# Patient Record
Sex: Male | Born: 1980
Health system: Southern US, Community
[De-identification: ages and names within clinical notes are randomized; demographics above are authoritative.]

## PROBLEM LIST (undated history)

## (undated) DIAGNOSIS — M545 Low back pain, unspecified: Secondary | ICD-10-CM

## (undated) DIAGNOSIS — N529 Male erectile dysfunction, unspecified: Secondary | ICD-10-CM

## (undated) DIAGNOSIS — F329 Major depressive disorder, single episode, unspecified: Secondary | ICD-10-CM

## (undated) DIAGNOSIS — A6000 Herpesviral infection of urogenital system, unspecified: Secondary | ICD-10-CM

## (undated) DIAGNOSIS — F32A Depression, unspecified: Secondary | ICD-10-CM

## (undated) DIAGNOSIS — H6992 Unspecified Eustachian tube disorder, left ear: Secondary | ICD-10-CM

## (undated) DIAGNOSIS — G43909 Migraine, unspecified, not intractable, without status migrainosus: Secondary | ICD-10-CM

## (undated) DIAGNOSIS — J189 Pneumonia, unspecified organism: Secondary | ICD-10-CM

## (undated) DIAGNOSIS — K259 Gastric ulcer, unspecified as acute or chronic, without hemorrhage or perforation: Secondary | ICD-10-CM

## (undated) DIAGNOSIS — F419 Anxiety disorder, unspecified: Secondary | ICD-10-CM

## (undated) DIAGNOSIS — H6982 Other specified disorders of Eustachian tube, left ear: Secondary | ICD-10-CM

## (undated) HISTORY — DX: Unspecified Eustachian tube disorder, left ear: H69.92

## (undated) HISTORY — DX: Major depressive disorder, single episode, unspecified: F32.9

## (undated) HISTORY — DX: Low back pain, unspecified: M54.50

## (undated) HISTORY — DX: Pneumonia, unspecified organism: J18.9

## (undated) HISTORY — DX: Depression, unspecified: F32.A

## (undated) HISTORY — DX: Anxiety disorder, unspecified: F41.9

## (undated) HISTORY — DX: Male erectile dysfunction, unspecified: N52.9

## (undated) HISTORY — DX: Gastric ulcer, unspecified as acute or chronic, without hemorrhage or perforation: K25.9

## (undated) HISTORY — DX: Low back pain: M54.5

## (undated) HISTORY — DX: Migraine, unspecified, not intractable, without status migrainosus: G43.909

## (undated) HISTORY — DX: Other specified disorders of eustachian tube, left ear: H69.82

## (undated) HISTORY — DX: Herpesviral infection of urogenital system, unspecified: A60.00

## (undated) HISTORY — PX: TONSILLECTOMY: SUR1361

---
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
## 2003-11-17 ENCOUNTER — Emergency Department (HOSPITAL_COMMUNITY): Admission: EM | Admit: 2003-11-17 | Discharge: 2003-11-17 | Payer: Self-pay

## 2003-11-26 ENCOUNTER — Ambulatory Visit: Admission: AD | Admit: 2003-11-26 | Discharge: 2003-11-26 | Payer: Self-pay | Admitting: Orthopedic Surgery

## 2004-02-22 ENCOUNTER — Ambulatory Visit (HOSPITAL_COMMUNITY): Admission: RE | Admit: 2004-02-22 | Discharge: 2004-02-22 | Payer: Self-pay | Admitting: Orthopedic Surgery

## 2007-02-22 ENCOUNTER — Ambulatory Visit: Payer: Self-pay | Admitting: Family Medicine

## 2009-12-31 ENCOUNTER — Emergency Department (HOSPITAL_COMMUNITY): Admission: EM | Admit: 2009-12-31 | Discharge: 2009-12-31 | Payer: Self-pay | Admitting: Emergency Medicine

## 2010-12-03 LAB — POCT I-STAT 3, ART BLOOD GAS (G3+)
Bicarbonate: 26.4 mEq/L — ABNORMAL HIGH (ref 20.0–24.0)
O2 Saturation: 96 %
Patient temperature: 95.3
TCO2: 28 mmol/L (ref 0–100)
pCO2 arterial: 46 mmHg — ABNORMAL HIGH (ref 35.0–45.0)
pH, Arterial: 7.358 (ref 7.350–7.450)
pO2, Arterial: 76 mmHg — ABNORMAL LOW (ref 80.0–100.0)

## 2010-12-03 LAB — DIFFERENTIAL
Basophils Absolute: 0 10*3/uL (ref 0.0–0.1)
Basophils Relative: 0 % (ref 0–1)
Eosinophils Absolute: 0.1 10*3/uL (ref 0.0–0.7)
Eosinophils Relative: 1 % (ref 0–5)
Lymphocytes Relative: 28 % (ref 12–46)
Lymphs Abs: 3 10*3/uL (ref 0.7–4.0)
Monocytes Absolute: 0.8 10*3/uL (ref 0.1–1.0)
Monocytes Relative: 8 % (ref 3–12)
Neutro Abs: 6.7 10*3/uL (ref 1.7–7.7)
Neutrophils Relative %: 63 % (ref 43–77)

## 2010-12-03 LAB — PROTIME-INR
INR: 1.06 (ref 0.00–1.49)
Prothrombin Time: 13.7 seconds (ref 11.6–15.2)

## 2010-12-03 LAB — CBC
HCT: 36.1 % — ABNORMAL LOW (ref 39.0–52.0)
Hemoglobin: 12.6 g/dL — ABNORMAL LOW (ref 13.0–17.0)
MCHC: 34.9 g/dL (ref 30.0–36.0)
MCV: 90.4 fL (ref 78.0–100.0)
Platelets: 196 10*3/uL (ref 150–400)
RBC: 4 MIL/uL — ABNORMAL LOW (ref 4.22–5.81)
RDW: 14 % (ref 11.5–15.5)
WBC: 10.7 10*3/uL — ABNORMAL HIGH (ref 4.0–10.5)

## 2010-12-03 LAB — URINALYSIS, ROUTINE W REFLEX MICROSCOPIC
Bilirubin Urine: NEGATIVE
Glucose, UA: 100 mg/dL — AB
Hgb urine dipstick: NEGATIVE
Ketones, ur: NEGATIVE mg/dL
Nitrite: NEGATIVE
Protein, ur: NEGATIVE mg/dL
Specific Gravity, Urine: 1.018 (ref 1.005–1.030)
Urobilinogen, UA: 0.2 mg/dL (ref 0.0–1.0)
pH: 5.5 (ref 5.0–8.0)

## 2010-12-03 LAB — HEPATIC FUNCTION PANEL
ALT: 16 U/L (ref 0–53)
AST: 16 U/L (ref 0–37)
Albumin: 3.3 g/dL — ABNORMAL LOW (ref 3.5–5.2)
Alkaline Phosphatase: 48 U/L (ref 39–117)
Bilirubin, Direct: <0.1 mg/dL (ref 0.0–0.3)
Total Bilirubin: 0.3 mg/dL (ref 0.3–1.2)
Total Protein: 5.4 g/dL — ABNORMAL LOW (ref 6.0–8.3)

## 2010-12-03 LAB — POCT I-STAT, CHEM 8
BUN: 11 mg/dL (ref 6–23)
Calcium, Ion: 1.03 mmol/L — ABNORMAL LOW (ref 1.12–1.32)
Chloride: 106 mEq/L (ref 96–112)
Creatinine, Ser: 0.8 mg/dL (ref 0.4–1.5)
Glucose, Bld: 167 mg/dL — ABNORMAL HIGH (ref 70–99)
HCT: 43 % (ref 39.0–52.0)
Hemoglobin: 14.6 g/dL (ref 13.0–17.0)
Potassium: 3.7 mEq/L (ref 3.5–5.1)
Sodium: 141 mEq/L (ref 135–145)
TCO2: 21 mmol/L (ref 0–100)

## 2010-12-03 LAB — RAPID URINE DRUG SCREEN, HOSP PERFORMED
Amphetamines: NOT DETECTED
Barbiturates: NOT DETECTED
Benzodiazepines: NOT DETECTED
Cocaine: NOT DETECTED
Opiates: NOT DETECTED
Tetrahydrocannabinol: NOT DETECTED

## 2010-12-03 LAB — POCT CARDIAC MARKERS
CKMB, poc: <1 ng/mL — ABNORMAL LOW (ref 1.0–8.0)
Myoglobin, poc: 50.2 ng/mL (ref 12–200)
Troponin i, poc: <0.05 ng/mL (ref 0.00–0.09)

## 2010-12-03 LAB — ACETAMINOPHEN LEVEL: Acetaminophen (Tylenol), Serum: <10 ug/mL — ABNORMAL LOW (ref 10–30)

## 2010-12-03 LAB — SALICYLATE LEVEL: Salicylate Lvl: <4 mg/dL (ref 2.8–20.0)

## 2010-12-03 LAB — TRICYCLICS SCREEN, URINE: TCA Scrn: NOT DETECTED

## 2010-12-03 LAB — ETHANOL: Alcohol, Ethyl (B): <5 mg/dL (ref 0–10)
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
## 2014-05-21 ENCOUNTER — Emergency Department (HOSPITAL_BASED_OUTPATIENT_CLINIC_OR_DEPARTMENT_OTHER)
Admission: EM | Admit: 2014-05-21 | Discharge: 2014-05-21 | Disposition: A | Discharge status: Home / Self Care | Payer: Self-pay | Attending: Emergency Medicine | Admitting: Emergency Medicine

## 2014-05-21 ENCOUNTER — Emergency Department (HOSPITAL_BASED_OUTPATIENT_CLINIC_OR_DEPARTMENT_OTHER): Payer: Self-pay

## 2014-05-21 ENCOUNTER — Encounter (HOSPITAL_BASED_OUTPATIENT_CLINIC_OR_DEPARTMENT_OTHER): Payer: Self-pay | Admitting: Emergency Medicine

## 2014-05-21 DIAGNOSIS — S9031XA Contusion of right foot, initial encounter: Secondary | ICD-10-CM

## 2014-05-21 DIAGNOSIS — F17200 Nicotine dependence, unspecified, uncomplicated: Secondary | ICD-10-CM | POA: Insufficient documentation

## 2014-05-21 DIAGNOSIS — Z23 Encounter for immunization: Secondary | ICD-10-CM | POA: Insufficient documentation

## 2014-05-21 DIAGNOSIS — IMO0002 Reserved for concepts with insufficient information to code with codable children: Secondary | ICD-10-CM | POA: Insufficient documentation

## 2014-05-21 DIAGNOSIS — T07XXXA Unspecified multiple injuries, initial encounter: Secondary | ICD-10-CM

## 2014-05-21 DIAGNOSIS — Y92410 Unspecified street and highway as the place of occurrence of the external cause: Secondary | ICD-10-CM | POA: Insufficient documentation

## 2014-05-21 DIAGNOSIS — S9030XA Contusion of unspecified foot, initial encounter: Secondary | ICD-10-CM | POA: Insufficient documentation

## 2014-05-21 DIAGNOSIS — Y9389 Activity, other specified: Secondary | ICD-10-CM | POA: Insufficient documentation

## 2014-05-21 DIAGNOSIS — S0990XA Unspecified injury of head, initial encounter: Secondary | ICD-10-CM | POA: Insufficient documentation

## 2014-05-21 DIAGNOSIS — S4980XA Other specified injuries of shoulder and upper arm, unspecified arm, initial encounter: Secondary | ICD-10-CM | POA: Insufficient documentation

## 2014-05-21 DIAGNOSIS — S46909A Unspecified injury of unspecified muscle, fascia and tendon at shoulder and upper arm level, unspecified arm, initial encounter: Secondary | ICD-10-CM | POA: Insufficient documentation

## 2014-05-21 MED ORDER — TETANUS-DIPHTH-ACELL PERTUSSIS 5-2.5-18.5 LF-MCG/0.5 IM SUSP
0.5000 mL | Freq: Once | INTRAMUSCULAR | Status: AC
  Administered 2014-05-21: 0.5 mL via INTRAMUSCULAR
  Filled 2014-05-21: qty 0.5

## 2014-05-21 MED ORDER — OXYCODONE-ACETAMINOPHEN 5-325 MG PO TABS
2.0000 | ORAL_TABLET | Freq: Once | ORAL | Status: AC
  Administered 2014-05-21: 2 via ORAL
  Filled 2014-05-21: qty 2

## 2014-05-21 MED ORDER — OXYCODONE-ACETAMINOPHEN 5-325 MG PO TABS: 1.0000 | ORAL_TABLET | ORAL | Status: DC | PRN

## 2014-05-21 NOTE — Discharge Instructions (Signed)
Abrasion An abrasion is a cut or scrape of the skin. Abrasions do not extend through all layers of the skin and most heal within 10 days. It is important to care for your abrasion properly to prevent infection. CAUSES  Most abrasions are caused by falling on, or gliding across, the ground or other surface. When your skin rubs on something, the outer and inner layer of skin rubs off, causing an abrasion. DIAGNOSIS  Your caregiver will be able to diagnose an abrasion during a physical exam.  TREATMENT  Your treatment depends on how large and deep the abrasion is. Generally, your abrasion will be cleaned with water and a mild soap to remove any dirt or debris. An antibiotic ointment may be put over the abrasion to prevent an infection. A bandage (dressing) may be wrapped around the abrasion to keep it from getting dirty.  You may need a tetanus shot if:  You cannot remember when you had your last tetanus shot.  You have never had a tetanus shot.  The injury broke your skin. If you get a tetanus shot, your arm may swell, get red, and feel warm to the touch. This is common and not a problem. If you need a tetanus shot and you choose not to have one, there is a rare chance of getting tetanus. Sickness from tetanus can be serious.  HOME CARE INSTRUCTIONS   If a dressing was applied, change it at least once a day or as directed by your caregiver. If the bandage sticks, soak it off with warm water.   Wash the area with water and a mild soap to remove all the ointment 2 times a day. Rinse off the soap and pat the area dry with a clean towel.   Reapply any ointment as directed by your caregiver. This will help prevent infection and keep the bandage from sticking. Use gauze over the wound and under the dressing to help keep the bandage from sticking.   Change your dressing right away if it becomes wet or dirty.   Only take over-the-counter or prescription medicines for pain, discomfort, or fever as  directed by your caregiver.   Follow up with your caregiver within 24-48 hours for a wound check, or as directed. If you were not given a wound-check appointment, look closely at your abrasion for redness, swelling, or pus. These are signs of infection. SEEK IMMEDIATE MEDICAL CARE IF:   You have increasing pain in the wound.   You have redness, swelling, or tenderness around the wound.   You have pus coming from the wound.   You have a fever or persistent symptoms for more than 2-3 days.  You have a fever and your symptoms suddenly get worse.  You have a bad smell coming from the wound or dressing.  MAKE SURE YOU:   Understand these instructions.  Will watch your condition.  Will get help right away if you are not doing well or get worse. Document Released: 06/11/2005 Document Revised: 08/18/2012 Document Reviewed: 08/05/2011 [facility] Patient Information 2015 [place], [place]. This information is not intended to replace advice given to you by your health care provider. Make sure you discuss any questions you have with your health care provider.    Blunt Trauma You have been evaluated for injuries. You have been examined and your caregiver has not found injuries serious enough to require hospitalization. It is common to have multiple bruises and sore muscles following an accident. These tend to feel worse for  the first 24 hours. You will feel more stiffness and soreness over the next several hours and worse when you wake up the first morning after your accident. After this point, you should begin to improve with each passing day. The amount of improvement depends on the amount of damage done in the accident. Following your accident, if some part of your body does not work as it should, or if the pain in any area continues to increase, you should return to the Emergency Department for re-evaluation.  HOME CARE INSTRUCTIONS  Routine care for sore areas should include:  Ice to sore  areas every 2 hours for 20 minutes while awake for the next 2 days.  Drink extra fluids (not alcohol).  Take a hot or warm shower or bath once or twice a day to increase blood flow to sore muscles. This will help you "limber up".  Activity as tolerated. Lifting may aggravate neck or back pain.  Only take over-the-counter or prescription medicines for pain, discomfort, or fever as directed by your caregiver. Do not use aspirin. This may increase bruising or increase bleeding if there are small areas where this is happening. SEEK IMMEDIATE MEDICAL CARE IF:  Numbness, tingling, weakness, or problem with the use of your arms or legs.  A severe headache is not relieved with medications.  There is a change in bowel or bladder control.  Increasing pain in any areas of the body.  Short of breath or dizzy.  Nauseated, vomiting, or sweating.  Increasing belly (abdominal) discomfort.  Blood in urine, stool, or vomiting blood.  Pain in either shoulder in an area where a shoulder strap would be.  Feelings of lightheadedness or if you have a fainting episode. Sometimes it is not possible to identify all injuries immediately after the trauma. It is important that you continue to monitor your condition after the emergency department visit. If you feel you are not improving, or improving more slowly than should be expected, call your physician. If you feel your symptoms (problems) are worsening, return to the Emergency Department immediately. Document Released: 05/28/2001 Document Revised: 11/24/2011 Document Reviewed: 04/19/2008 [facility] Patient Information 2015 [place], [place]. This information is not intended to replace advice given to you by your health care provider. Make sure you discuss any questions you have with your health care provider.    Contusion A contusion is a deep bruise. Contusions are the result of an injury that caused bleeding under the skin. The contusion may turn blue,  purple, or yellow. Minor injuries will give you a painless contusion, but more severe contusions may stay painful and swollen for a few weeks.  CAUSES  A contusion is usually caused by a blow, trauma, or direct force to an area of the body. SYMPTOMS   Swelling and redness of the injured area.  Bruising of the injured area.  Tenderness and soreness of the injured area.  Pain. DIAGNOSIS  The diagnosis can be made by taking a history and physical exam. An X-ray, CT scan, or MRI may be needed to determine if there were any associated injuries, such as fractures. TREATMENT  Specific treatment will depend on what area of the body was injured. In general, the best treatment for a contusion is resting, icing, elevating, and applying cold compresses to the injured area. Over-the-counter medicines may also be recommended for pain control. Ask your caregiver what the best treatment is for your contusion. HOME CARE INSTRUCTIONS   Put ice on the injured area.  Put ice in a plastic bag.  Place a towel between your skin and the bag.  Leave the ice on for 15-20 minutes, 3-4 times a day, or as directed by your health care provider.  Only take over-the-counter or prescription medicines for pain, discomfort, or fever as directed by your caregiver. Your caregiver may recommend avoiding anti-inflammatory medicines (aspirin, ibuprofen, and naproxen) for 48 hours because these medicines may increase bruising.  Rest the injured area.  If possible, elevate the injured area to reduce swelling. SEEK IMMEDIATE MEDICAL CARE IF:   You have increased bruising or swelling.  You have pain that is getting worse.  Your swelling or pain is not relieved with medicines. MAKE SURE YOU:   Understand these instructions.  Will watch your condition.  Will get help right away if you are not doing well or get worse. Document Released: 06/11/2005 Document Revised: 09/06/2013 Document Reviewed: 07/07/2011 [facility]  Patient Information 2015 [place], [place]. This information is not intended to replace advice given to you by your health care provider. Make sure you discuss any questions you have with your health care provider.   Head Injury You have received a head injury. It does not appear serious at this time. Headaches and vomiting are common following head injury. It should be easy to awaken from sleeping. Sometimes it is necessary for you to stay in the emergency department for a while for observation. Sometimes admission to the hospital may be needed. After injuries such as yours, most problems occur within the first 24 hours, but side effects may occur up to 7-10 days after the injury. It is important for you to carefully monitor your condition and contact your health care provider or seek immediate medical care if there is a change in your condition. WHAT ARE THE TYPES OF HEAD INJURIES? Head injuries can be as minor as a bump. Some head injuries can be more severe. More severe head injuries include:  A jarring injury to the brain (concussion).  A bruise of the brain (contusion). This mean there is bleeding in the brain that can cause swelling.  A cracked skull (skull fracture).  Bleeding in the brain that collects, clots, and forms a bump (hematoma). WHAT CAUSES A HEAD INJURY? A serious head injury is most likely to happen to someone who is in a car wreck and is not wearing a seat belt. Other causes of major head injuries include bicycle or motorcycle accidents, sports injuries, and falls. HOW ARE HEAD INJURIES DIAGNOSED? A complete history of the event leading to the injury and your current symptoms will be helpful in diagnosing head injuries. Many times, pictures of the brain, such as CT or MRI are needed to see the extent of the injury. Often, an overnight hospital stay is necessary for observation.  WHEN SHOULD I SEEK IMMEDIATE MEDICAL CARE?  You should get help right away if:  You have confusion  or drowsiness.  You feel sick to your stomach (nauseous) or have continued, forceful vomiting.  You have dizziness or unsteadiness that is getting worse.  You have severe, continued headaches not relieved by medicine. Only take over-the-counter or prescription medicines for pain, fever, or discomfort as directed by your health care provider.  You do not have normal function of the arms or legs or are unable to walk.  You notice changes in the black spots in the center of the colored part of your eye (pupil).  You have a clear or bloody fluid coming from your nose  or ears.  You have a loss of vision. During the next 24 hours after the injury, you must stay with someone who can watch you for the warning signs. This person should contact local emergency services (911 in the [place] if you have seizures, you become unconscious, or you are unable to wake up. HOW CAN I PREVENT A HEAD INJURY IN THE FUTURE? The most important factor for preventing major head injuries is avoiding motor vehicle accidents. To minimize the potential for damage to your head, it is crucial to wear seat belts while riding in motor vehicles. Wearing helmets while bike riding and playing collision sports (like football) is also helpful. Also, avoiding dangerous activities around the house will further help reduce your risk of head injury.  WHEN CAN I RETURN TO NORMAL ACTIVITIES AND ATHLETICS? You should be reevaluated by your health care provider before returning to these activities. If you have any of the following symptoms, you should not return to activities or contact sports until 1 week after the symptoms have stopped:  Persistent headache.  Dizziness or vertigo.  Poor attention and concentration.  Confusion.  Memory problems.  Nausea or vomiting.  Fatigue or tire easily.  Irritability.  Intolerant of bright lights or loud noises.  Anxiety or depression.  Disturbed sleep. MAKE SURE YOU:   Understand  these instructions.  Will watch your condition.  Will get help right away if you are not doing well or get worse. Document Released: 09/01/2005 Document Revised: 09/06/2013 Document Reviewed: 05/09/2013 [facility] Patient Information 2015 [place], [place]. This information is not intended to replace advice given to you by your health care provider. Make sure you discuss any questions you have with your health care provider.   Wound Care Wound care helps prevent pain and infection.  You may need a tetanus shot if:  You cannot remember when you had your last tetanus shot.  You have never had a tetanus shot.  The injury broke your skin. If you need a tetanus shot and you choose not to have one, you may get tetanus. Sickness from tetanus can be serious. HOME CARE   Only take medicine as told by your doctor.  Clean the wound daily with mild soap and water.  Change any bandages (dressings) as told by your doctor.  Put medicated cream and a bandage on the wound as told by your doctor.  Change the bandage if it gets wet, dirty, or starts to smell.  Take showers. Do not take baths, swim, or do anything that puts your wound under water.  Rest and raise (elevate) the wound until the pain and puffiness (swelling) are better.  Keep all doctor visits as told. GET HELP RIGHT AWAY IF:   Yellowish-white fluid (pus) comes from the wound.  Medicine does not lessen your pain.  There is a red streak going away from the wound.  You have a fever. MAKE SURE YOU:   Understand these instructions.  Will watch your condition.  Will get help right away if you are not doing well or get worse. Document Released: 06/10/2008 Document Revised: 11/24/2011 Document Reviewed: 01/05/2011 [facility] Patient Information 2015 [place], [place]. This information is not intended to replace advice given to you by your health care provider. Make sure you discuss any questions you have with your health care  provider.
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
## 2014-05-21 NOTE — ED Notes (Signed)
ICE pack to rt foot

## 2014-09-15 HISTORY — PX: APPENDECTOMY: SHX54

## 2014-11-27 ENCOUNTER — Telehealth: Payer: Self-pay | Admitting: Family Medicine

## 2014-11-27 ENCOUNTER — Encounter (HOSPITAL_BASED_OUTPATIENT_CLINIC_OR_DEPARTMENT_OTHER): Payer: Self-pay | Admitting: Emergency Medicine

## 2014-11-27 ENCOUNTER — Emergency Department (HOSPITAL_BASED_OUTPATIENT_CLINIC_OR_DEPARTMENT_OTHER)
Admission: EM | Admit: 2014-11-27 | Discharge: 2014-11-27 | Discharge status: Against Medical Advice | Payer: Managed Care, Other (non HMO) | Attending: Emergency Medicine | Admitting: Emergency Medicine

## 2014-11-27 DIAGNOSIS — Z720 Tobacco use: Secondary | ICD-10-CM | POA: Insufficient documentation

## 2014-11-27 DIAGNOSIS — R109 Unspecified abdominal pain: Secondary | ICD-10-CM | POA: Insufficient documentation

## 2014-11-27 NOTE — ED Notes (Signed)
Pt LWBS after triage.

## 2014-11-27 NOTE — ED Notes (Signed)
Pt states he has been having abd pain since yesterday and just not getting any better.  Denies any n/v/d

## 2014-12-05 NOTE — Telephone Encounter (Signed)
Patient did not respond to attempts to contact. Encounter closed.

## 2015-03-26 ENCOUNTER — Encounter: Payer: Self-pay | Admitting: Family Medicine

## 2015-03-26 ENCOUNTER — Ambulatory Visit (INDEPENDENT_AMBULATORY_CARE_PROVIDER_SITE_OTHER): Payer: Managed Care, Other (non HMO) | Admitting: Family Medicine

## 2015-03-26 VITALS — BP 109/75 | HR 100 | Temp 97.9°F | Resp 16 | Ht 66.5 in | Wt 174.0 lb

## 2015-03-26 DIAGNOSIS — S39012A Strain of muscle, fascia and tendon of lower back, initial encounter: Secondary | ICD-10-CM | POA: Diagnosis not present

## 2015-03-26 MED ORDER — OXYCODONE-ACETAMINOPHEN 5-325 MG PO TABS: ORAL_TABLET | ORAL | Status: DC
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
## 2015-03-26 NOTE — Patient Instructions (Signed)
Back Exercises These exercises may help you when beginning to rehabilitate your injury. Your symptoms may resolve with or without further involvement from your physician, physical therapist or athletic trainer. While completing these exercises, remember:   Restoring tissue flexibility helps normal motion to return to the joints. This allows healthier, less painful movement and activity.  An effective stretch should be held for at least 30 seconds.  A stretch should never be painful. You should only feel a gentle lengthening or release in the stretched tissue. STRETCH - Extension, Prone on Elbows   Lie on your stomach on the floor, a bed will be too soft. Place your palms about shoulder width apart and at the height of your head.  Place your elbows under your shoulders. If this is too painful, stack pillows under your chest.  Allow your body to relax so that your hips drop lower and make contact more completely with the floor.  Hold this position for __________ seconds.  Slowly return to lying flat on the floor. Repeat __________ times. Complete this exercise __________ times per day.  RANGE OF MOTION - Extension, Prone Press Ups   Lie on your stomach on the floor, a bed will be too soft. Place your palms about shoulder width apart and at the height of your head.  Keeping your back as relaxed as possible, slowly straighten your elbows while keeping your hips on the floor. You may adjust the placement of your hands to maximize your comfort. As you gain motion, your hands will come more underneath your shoulders.  Hold this position __________ seconds.  Slowly return to lying flat on the floor. Repeat __________ times. Complete this exercise __________ times per day.  RANGE OF MOTION- Quadruped, Neutral Spine   Assume a hands and knees position on a firm surface. Keep your hands under your shoulders and your knees under your hips. You may place padding under your knees for  comfort.  Drop your head and point your tail bone toward the ground below you. This will round out your low back like an angry cat. Hold this position for __________ seconds.  Slowly lift your head and release your tail bone so that your back sags into a large arch, like an old horse.  Hold this position for __________ seconds.  Repeat this until you feel limber in your low back.  Now, find your "sweet spot." This will be the most comfortable position somewhere between the two previous positions. This is your neutral spine. Once you have found this position, tense your stomach muscles to support your low back.  Hold this position for __________ seconds. Repeat __________ times. Complete this exercise __________ times per day.  STRETCH - Flexion, Single Knee to Chest   Lie on a firm bed or floor with both legs extended in front of you.  Keeping one leg in contact with the floor, bring your opposite knee to your chest. Hold your leg in place by either grabbing behind your thigh or at your knee.  Pull until you feel a gentle stretch in your low back. Hold __________ seconds.  Slowly release your grasp and repeat the exercise with the opposite side. Repeat __________ times. Complete this exercise __________ times per day.  STRETCH - Hamstrings, Standing  Stand or sit and extend your right / left leg, placing your foot on a chair or foot stool  Keeping a slight arch in your low back and your hips straight forward.  Lead with your chest and   lean forward at the waist until you feel a gentle stretch in the back of your right / left knee or thigh. (When done correctly, this exercise requires leaning only a small distance.)  Hold this position for __________ seconds. Repeat __________ times. Complete this stretch __________ times per day. STRENGTHENING - Deep Abdominals, Pelvic Tilt   Lie on a firm bed or floor. Keeping your legs in front of you, bend your knees so they are both pointed  toward the ceiling and your feet are flat on the floor.  Tense your lower abdominal muscles to press your low back into the floor. This motion will rotate your pelvis so that your tail bone is scooping upwards rather than pointing at your feet or into the floor.  With a gentle tension and even breathing, hold this position for __________ seconds. Repeat __________ times. Complete this exercise __________ times per day.  STRENGTHENING - Abdominals, Crunches   Lie on a firm bed or floor. Keeping your legs in front of you, bend your knees so they are both pointed toward the ceiling and your feet are flat on the floor. Cross your arms over your chest.  Slightly tip your chin down without bending your neck.  Tense your abdominals and slowly lift your trunk high enough to just clear your shoulder blades. Lifting higher can put excessive stress on the low back and does not further strengthen your abdominal muscles.  Control your return to the starting position. Repeat __________ times. Complete this exercise __________ times per day.  STRENGTHENING - Quadruped, Opposite UE/LE Lift   Assume a hands and knees position on a firm surface. Keep your hands under your shoulders and your knees under your hips. You may place padding under your knees for comfort.  Find your neutral spine and gently tense your abdominal muscles so that you can maintain this position. Your shoulders and hips should form a rectangle that is parallel with the floor and is not twisted.  Keeping your trunk steady, lift your right hand no higher than your shoulder and then your left leg no higher than your hip. Make sure you are not holding your breath. Hold this position __________ seconds.  Continuing to keep your abdominal muscles tense and your back steady, slowly return to your starting position. Repeat with the opposite arm and leg. Repeat __________ times. Complete this exercise __________ times per day. Document Released:  09/19/2005 Document Revised: 11/24/2011 Document Reviewed: 12/14/2008 Exit[employer] Patient Information 2015 [employer]. This information is not intended to replace advice given to you by your health care provider. Make sure you discuss any questions you have with your health care provider.  

## 2015-03-26 NOTE — Progress Notes (Signed)
Pre visit review using our clinic review tool, if applicable. No additional management support is needed unless otherwise documented below in the visit note. 

## 2015-04-02 ENCOUNTER — Encounter: Payer: Self-pay | Admitting: *Deleted

## 2015-04-02 ENCOUNTER — Ambulatory Visit (INDEPENDENT_AMBULATORY_CARE_PROVIDER_SITE_OTHER): Payer: Managed Care, Other (non HMO) | Admitting: Family Medicine

## 2015-04-02 ENCOUNTER — Encounter: Payer: Self-pay | Admitting: Family Medicine

## 2015-04-02 VITALS — BP 114/76 | HR 77 | Temp 97.8°F | Resp 16 | Ht 66.5 in | Wt 176.0 lb

## 2015-04-02 DIAGNOSIS — S39012D Strain of muscle, fascia and tendon of lower back, subsequent encounter: Secondary | ICD-10-CM | POA: Diagnosis not present

## 2015-04-02 NOTE — Progress Notes (Signed)
Pre visit review using our clinic review tool, if applicable. No additional management support is needed unless otherwise documented below in the visit note. 

## 2015-04-02 NOTE — Progress Notes (Signed)
OFFICE NOTE  04/02/2015  CC:  Chief Complaint  Patient presents with  . Follow-up    1 week f/u. Pt is fasting.      HPI: Patient is a 34 y.o. Caucasian male who is here for 1 week f/u low back strain.   I gave him percocet, home stretches, note to stay out of work until today.   Much improved! Taking ibuprofen, no longer on pain meds.  He feels much improved and is ready to return to work tomorrow.  Pertinent PMH:  Past medical, surgical, social, and family history reviewed and no changes are noted since last office visit.  MEDS:  Outpatient Prescriptions Prior to Visit  Medication Sig Dispense Refill  . oxyCODONE-acetaminophen (PERCOCET/ROXICET) 5-325 MG per tablet 1-2 tabs po tid prn 40 tablet 0   No facility-administered medications prior to visit.    PE: Blood pressure 114/76, pulse 77, temperature 97.8 F (36.6 C), temperature source Oral, resp. rate 16, height 5' 6.5" (1.689 m), weight 176 lb (79.833 kg), SpO2 96 %. Gen: Alert, well appearing.  Patient is oriented to person, place, time, and situation. LB: no TTP.  L/S spine ROM fully intact with only mild discomfort with L spine extension. Sitting SLR neg bilat, with only a small amount of LB discomfort with full R leg extension. LE strength 5/5 prox and dist. LE DTRs 2+ patellar and achilles bilat.  IMPRESSION AND PLAN:  Low back strain, much improved and ready to return to work without restrictions. Correct lifting reviewed. May continue ibuprofen prn. No new rx's given today. Letter given today to excuse him from work for the last week, may return tomorrow.  An After Visit Summary was printed and given to the patient.  FOLLOW UP: prn

## 2015-07-09 ENCOUNTER — Encounter: Payer: Self-pay | Admitting: Family Medicine

## 2015-07-09 ENCOUNTER — Ambulatory Visit (INDEPENDENT_AMBULATORY_CARE_PROVIDER_SITE_OTHER): Payer: Managed Care, Other (non HMO) | Admitting: Family Medicine

## 2015-07-09 VITALS — BP 116/74 | HR 77 | Temp 98.5°F | Resp 20 | Ht 67.0 in | Wt 176.5 lb

## 2015-07-09 DIAGNOSIS — R5383 Other fatigue: Secondary | ICD-10-CM

## 2015-07-09 DIAGNOSIS — F418 Other specified anxiety disorders: Secondary | ICD-10-CM | POA: Diagnosis not present

## 2015-07-09 MED ORDER — TRAZODONE HCL 50 MG PO TABS: 50.0000 mg | ORAL_TABLET | Freq: Every day | ORAL | Status: DC

## 2015-07-09 MED ORDER — VENLAFAXINE HCL ER 37.5 MG PO CP24: ORAL_CAPSULE | ORAL | Status: DC

## 2015-07-09 NOTE — Patient Instructions (Signed)
Insomnia Insomnia is a sleep disorder that makes it difficult to fall asleep or to stay asleep. Insomnia can cause tiredness (fatigue), low energy, difficulty concentrating, mood swings, and poor performance at work or school.  There are three different ways to classify insomnia:  Difficulty falling asleep.  Difficulty staying asleep.  Waking up too early in the morning. Any type of insomnia can be long-term (chronic) or short-term (acute). Both are common. Short-term insomnia usually lasts for three months or less. Chronic insomnia occurs at least three times a week for longer than three months. CAUSES  Insomnia may be caused by another condition, situation, or substance, such as:  Anxiety.  Certain medicines.  Gastroesophageal reflux disease (GERD) or other gastrointestinal conditions.  Asthma or other breathing conditions.  Restless legs syndrome, sleep apnea, or other sleep disorders.  Chronic pain.  Menopause. This may include hot flashes.  Stroke.  Abuse of alcohol, tobacco, or illegal drugs.  Depression.  Caffeine.   Neurological disorders, such as Alzheimer disease.  An overactive thyroid (hyperthyroidism). The cause of insomnia may not be known. RISK FACTORS Risk factors for insomnia include:  Gender. Women are more commonly affected than men.  Age. Insomnia is more common as you get older.  Stress. This may involve your professional or personal life.  Income. Insomnia is more common in people with lower income.  Lack of exercise.   Irregular work schedule or night shifts.  Traveling between different time zones. SIGNS AND SYMPTOMS If you have insomnia, trouble falling asleep or trouble staying asleep is the main symptom. This may lead to other symptoms, such as:  Feeling fatigued.  Feeling nervous about going to sleep.  Not feeling rested in the morning.  Having trouble concentrating.  Feeling irritable, anxious, or depressed. TREATMENT   Treatment for insomnia depends on the cause. If your insomnia is caused by an underlying condition, treatment will focus on addressing the condition. Treatment may also include:   Medicines to help you sleep.  Counseling or therapy.  Lifestyle adjustments. HOME CARE INSTRUCTIONS   Take medicines only as directed by your health care provider.  Keep regular sleeping and waking hours. Avoid naps.  Keep a sleep diary to help you and your health care provider figure out what could be causing your insomnia. Include:   When you sleep.  When you wake up during the night.  How well you sleep.   How rested you feel the next day.  Any side effects of medicines you are taking.  What you eat and drink.   Make your bedroom a comfortable place where it is easy to fall asleep:  Put up shades or special blackout curtains to block light from outside.  Use a white noise machine to block noise.  Keep the temperature cool.   Exercise regularly as directed by your health care provider. Avoid exercising right before bedtime.  Use relaxation techniques to manage stress. Ask your health care provider to suggest some techniques that may work well for you. These may include:  Breathing exercises.  Routines to release muscle tension.  Visualizing peaceful scenes.  Cut back on alcohol, caffeinated beverages, and cigarettes, especially close to bedtime. These can disrupt your sleep.  Do not overeat or eat spicy foods right before bedtime. This can lead to digestive discomfort that can make it hard for you to sleep.  Limit screen use before bedtime. This includes:  Watching TV.  Using your smartphone, tablet, and computer.  Stick to a routine. This   can help you fall asleep faster. Try to do a quiet activity, brush your teeth, and go to bed at the same time each night.  Get out of bed if you are still awake after 15 minutes of trying to sleep. Keep the lights down, but try reading or  doing a quiet activity. When you feel sleepy, go back to bed.  Make sure that you drive carefully. Avoid driving if you feel very sleepy.  Keep all follow-up appointments as directed by your health care provider. This is important. SEEK MEDICAL CARE IF:   You are tired throughout the day or have trouble in your daily routine due to sleepiness.  You continue to have sleep problems or your sleep problems get worse. SEEK IMMEDIATE MEDICAL CARE IF:   You have serious thoughts about hurting yourself or someone else.   This information is not intended to replace advice given to you by your health care provider. Make sure you discuss any questions you have with your health care provider.   Document Released: 08/29/2000 Document Revised: 05/23/2015 Document Reviewed: 06/02/2014 Elsevier Interactive Patient Education 2016 Elsevier Inc.  Generalized Anxiety Disorder Generalized anxiety disorder (GAD) is a mental disorder. It interferes with life functions, including relationships, work, and school. GAD is different from normal anxiety, which everyone experiences at some point in their lives in response to specific life events and activities. Normal anxiety actually helps [place] prepare for and get through these life events and activities. Normal anxiety goes away after the event or activity is over.  GAD causes anxiety that is not necessarily related to specific events or activities. It also causes excess anxiety in proportion to specific events or activities. The anxiety associated with GAD is also difficult to control. GAD can vary from mild to severe. People with severe GAD can have intense waves of anxiety with physical symptoms (panic attacks).  SYMPTOMS The anxiety and worry associated with GAD are difficult to control. This anxiety and worry are related to many life events and activities and also occur more days than not for 6 months or longer. People with GAD also have three or more of the  following symptoms (one or more in children): Restlessness.  Fatigue. Difficulty concentrating.  Irritability. Muscle tension. Difficulty sleeping or unsatisfying sleep. DIAGNOSIS GAD is diagnosed through an assessment by your health care provider. Your health care provider will ask you questions aboutyour mood,physical symptoms, and events in your life. Your health care provider may ask you about your medical history and use of alcohol or drugs, including prescription medicines. Your health care provider may also do a physical exam and blood tests. Certain medical conditions and the use of certain substances can cause symptoms similar to those associated with GAD. Your health care provider may refer you to a mental health specialist for further evaluation. TREATMENT The following therapies are usually used to treat GAD:  Medication. Antidepressant medication usually is prescribed for long-term daily control. Antianxiety medicines may be added in severe cases, especially when panic attacks occur.  Talk therapy (psychotherapy). Certain types of talk therapy can be helpful in treating GAD by providing support, education, and guidance. A form of talk therapy called cognitive behavioral therapy can teach you healthy ways to think about and react to daily life events and activities. Stress managementtechniques. These include yoga, meditation, and exercise and can be very helpful when they are practiced regularly. A mental health specialist can help determine which treatment is best for you. Some people see improvement  with one therapy. However, other people require a combination of therapies.   This information is not intended to replace advice given to you by your health care provider. Make sure you discuss any questions you have with your health care provider.   Document Released: 12/27/2012 Document Revised: 09/22/2014 Document Reviewed: 12/27/2012 Elsevier Interactive Patient Education 2016  Elsevier Inc.   Major Depressive Disorder Major depressive disorder is a mental illness. It also may be called clinical depression or unipolar depression. Major depressive disorder usually causes feelings of sadness, hopelessness, or helplessness. Some people with this disorder do not feel particularly sad but lose interest in doing things they used to enjoy (anhedonia). Major depressive disorder also can cause physical symptoms. It can interfere with work, school, relationships, and other normal everyday activities. The disorder varies in severity but is longer lasting and more serious than the sadness we all feel from time to time in our lives. Major depressive disorder often is triggered by stressful life events or major life changes. Examples of these triggers include divorce, loss of your job or home, a move, and the death of a family member or close friend. Sometimes this disorder occurs for no obvious reason at all. People who have family members with major depressive disorder or bipolar disorder are at higher risk for developing this disorder, with or without life stressors. Major depressive disorder can occur at any age. It may occur just once in your life (single episode major depressive disorder). It may occur multiple times (recurrent major depressive disorder). SYMPTOMS People with major depressive disorder have either anhedonia or depressed mood on nearly a daily basis for at least 2 weeks or longer. Symptoms of depressed mood include:  Feelings of sadness (blue or down in the dumps) or emptiness.  Feelings of hopelessness or helplessness.  Tearfulness or episodes of crying (may be observed by others).  Irritability (children and adolescents). In addition to depressed mood or anhedonia or both, people with this disorder have at least four of the following symptoms:  Difficulty sleeping or sleeping too much.   Significant change (increase or decrease) in appetite or weight.    Lack of energy or motivation.  Feelings of guilt and worthlessness.   Difficulty concentrating, remembering, or making decisions.  Unusually slow movement (psychomotor retardation) or restlessness (as observed by others).   Recurrent wishes for death, recurrent thoughts of self-harm (suicide), or a suicide attempt. People with major depressive disorder commonly have persistent negative thoughts about themselves, other people, and the world. People with severe major depressive disorder may experiencedistorted beliefs or perceptions about the world (psychotic delusions). They also may see or hear things that are not real (psychotic hallucinations). DIAGNOSIS Major depressive disorder is diagnosed through an assessment by your health care provider. Your health care provider will ask aboutaspects of your daily life, such as mood,sleep, and appetite, to see if you have the diagnostic symptoms of major depressive disorder. Your health care provider may ask about your medical history and use of alcohol or drugs, including prescription medicines. Your health care provider also may do a physical exam and blood work. This is because certain medical conditions and the use of certain substances can cause major depressive disorder-like symptoms (secondary depression). Your health care provider also may refer you to a mental health specialist for further evaluation and treatment. TREATMENT It is important to recognize the symptoms of major depressive disorder and seek treatment. The following treatments can be prescribed for this disorder:   Medicine. Antidepressant  medicines usually are prescribed. Antidepressant medicines are thought to correct chemical imbalances in the brain that are commonly associated with major depressive disorder. Other types of medicine may be added if the symptoms do not respond to antidepressant medicines alone or if psychotic delusions or hallucinations occur.  Talk  therapy. Talk therapy can be helpful in treating major depressive disorder by providing support, education, and guidance. Certain types of talk therapy also can help with negative thinking (cognitive behavioral therapy) and with relationship issues that trigger this disorder (interpersonal therapy). A mental health specialist can help determine which treatment is best for you. Most people with major depressive disorder do well with a combination of medicine and talk therapy. Treatments involving electrical stimulation of the brain can be used in situations with extremely severe symptoms or when medicine and talk therapy do not work over time. These treatments include electroconvulsive therapy, transcranial magnetic stimulation, and vagal nerve stimulation.   This information is not intended to replace advice given to you by your health care provider. Make sure you discuss any questions you have with your health care provider.   Document Released: 12/27/2012 Document Revised: 09/22/2014 Document Reviewed: 12/27/2012 Elsevier Interactive Patient Education 2016 [employer].  [place] w 4 weeks. Start effexor taper. 1 pill for 7 days, then two pills at the same time until you see me next. Start trazodone at night 1 to 2 pills. Take 1 hour prior to bed.  Try to make a "bed time", no TV. Bedroom is for two things only.
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
## 2015-12-20 ENCOUNTER — Ambulatory Visit (INDEPENDENT_AMBULATORY_CARE_PROVIDER_SITE_OTHER): Payer: Managed Care, Other (non HMO)

## 2015-12-20 ENCOUNTER — Encounter: Payer: Self-pay | Admitting: *Deleted

## 2015-12-20 ENCOUNTER — Encounter: Payer: Self-pay | Admitting: Family Medicine

## 2015-12-20 ENCOUNTER — Ambulatory Visit (INDEPENDENT_AMBULATORY_CARE_PROVIDER_SITE_OTHER): Payer: Managed Care, Other (non HMO) | Admitting: Family Medicine

## 2015-12-20 VITALS — BP 126/75 | HR 110 | Temp 98.2°F | Resp 20 | Wt 173.2 lb

## 2015-12-20 DIAGNOSIS — S39012A Strain of muscle, fascia and tendon of lower back, initial encounter: Secondary | ICD-10-CM

## 2015-12-20 DIAGNOSIS — M545 Low back pain, unspecified: Secondary | ICD-10-CM

## 2015-12-20 DIAGNOSIS — F411 Generalized anxiety disorder: Secondary | ICD-10-CM

## 2015-12-20 DIAGNOSIS — G8929 Other chronic pain: Secondary | ICD-10-CM

## 2015-12-20 MED ORDER — OXYCODONE-ACETAMINOPHEN 5-325 MG PO TABS: 1.0000 | ORAL_TABLET | Freq: Four times a day (QID) | ORAL | Status: DC | PRN

## 2015-12-20 MED ORDER — DICLOFENAC SODIUM 75 MG PO TBEC: DELAYED_RELEASE_TABLET | ORAL | Status: DC
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
## 2015-12-20 NOTE — Patient Instructions (Signed)

## 2015-12-21 ENCOUNTER — Telehealth: Payer: Self-pay

## 2015-12-21 NOTE — Telephone Encounter (Signed)
-----   Message from Jeoffrey MassedPhilip H McGowen, MD sent at 12/21/2015  7:58 AM EDT ----- Low back x-ray completely normal.

## 2015-12-21 NOTE — Telephone Encounter (Signed)
Called patient to give x-ray results. No answer. Left vmail.

## 2016-04-21 ENCOUNTER — Ambulatory Visit (INDEPENDENT_AMBULATORY_CARE_PROVIDER_SITE_OTHER): Payer: Managed Care, Other (non HMO) | Admitting: Family Medicine

## 2016-04-21 ENCOUNTER — Encounter: Payer: Self-pay | Admitting: Family Medicine

## 2016-04-21 VITALS — BP 122/79 | HR 89 | Temp 99.0°F | Resp 20 | Wt 162.0 lb

## 2016-04-21 DIAGNOSIS — F411 Generalized anxiety disorder: Secondary | ICD-10-CM | POA: Diagnosis not present

## 2016-04-21 DIAGNOSIS — F418 Other specified anxiety disorders: Secondary | ICD-10-CM

## 2016-04-21 MED ORDER — VENLAFAXINE HCL ER 37.5 MG PO CP24: 37.5000 mg | ORAL_CAPSULE | Freq: Every day | ORAL | 0 refills | Status: DC

## 2016-04-21 MED ORDER — LORAZEPAM 0.5 MG PO TABS: 0.5000 mg | ORAL_TABLET | Freq: Two times a day (BID) | ORAL | 0 refills | Status: DC | PRN

## 2016-04-21 MED ORDER — VENLAFAXINE HCL ER 75 MG PO TB24: 75.0000 mg | ORAL_TABLET | Freq: Every day | ORAL | 0 refills | Status: DC

## 2016-04-21 NOTE — Patient Instructions (Addendum)
Please get in touch with Dr. Craige CottaKirby or try calling [facility] for [facility].    I have written a scrip for ativan (only script for short term use).  I have also started effexor 37.5 mg for 7 days and then start 75 mg daily thereafter.  Followup in 4 weeks, sooner if needed.   Generalized Anxiety Disorder Generalized anxiety disorder (GAD) is a mental disorder. It interferes with life functions, including relationships, work, and school. GAD is different from normal anxiety, which everyone experiences at some point in their lives in response to specific life events and activities. Normal anxiety actually helps us prepare for and get through these life events and activities. Normal anxiety goes away after the event or activity is over.  GAD causes anxiety that is not necessarily related to specific events or activities. It also causes excess anxiety in proportion to specific events or activities. The anxiety associated with GAD is also difficult to control. GAD can vary from mild to severe. People with severe GAD can have intense waves of anxiety with physical symptoms (panic attacks).  SYMPTOMS The anxiety and worry associated with GAD are difficult to control. This anxiety and worry are related to many life events and activities and also occur more days than not for 6 months or longer. People with GAD also have three or more of the following symptoms (one or more in children):  Restlessness.   Fatigue.  Difficulty concentrating.   Irritability.  Muscle tension.  Difficulty sleeping or unsatisfying sleep. DIAGNOSIS GAD is diagnosed through an assessment by your health care provider. Your health care provider will ask you questions aboutyour mood,physical symptoms, and events in your life. Your health care provider may ask you about your medical history and use of alcohol or drugs, including prescription medicines. Your health care provider may also do a physical exam  and blood tests. Certain medical conditions and the use of certain substances can cause symptoms similar to those associated with GAD. Your health care provider may refer you to a mental health specialist for further evaluation. TREATMENT The following therapies are usually used to treat GAD:   Medication. Antidepressant medication usually is prescribed for long-term daily control. Antianxiety medicines may be added in severe cases, especially when panic attacks occur.   Talk therapy (psychotherapy). Certain types of talk therapy can be helpful in treating GAD by providing support, education, and guidance. A form of talk therapy called cognitive behavioral therapy can teach you healthy ways to think about and react to daily life events and activities.  Stress managementtechniques. These include yoga, meditation, and exercise and can be very helpful when they are practiced regularly. A mental health specialist can help determine which treatment is best for you. Some people see improvement with one therapy. However, other people require a combination of therapies.   This information is not intended to replace advice given to you by your health care provider. Make sure you discuss any questions you have with your health care provider.   Document Released: 12/27/2012 Document Revised: 09/22/2014 Document Reviewed: 12/27/2012 Elsevier Interactive Patient Education [employer]2016 Elsevier Inc.
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
## 2016-06-03 ENCOUNTER — Other Ambulatory Visit: Payer: Self-pay | Admitting: Family Medicine

## 2016-06-03 MED ORDER — VENLAFAXINE HCL ER 75 MG PO TB24: 75.0000 mg | ORAL_TABLET | Freq: Every day | ORAL | 0 refills | Status: DC

## 2016-06-03 NOTE — Telephone Encounter (Signed)
Patient called requesting RF of venlafaxine 75 mg tabs.  Patient states he just started new job and is unable to get off work for the first 90 days so he isn't able to schedule a follow up at this time.  Please advise RF.

## 2016-06-04 ENCOUNTER — Telehealth: Payer: Self-pay | Admitting: Family Medicine

## 2016-06-04 MED ORDER — FLUOXETINE HCL 20 MG PO TABS
20.0000 mg | ORAL_TABLET | Freq: Every day | ORAL | 3 refills | Status: DC
Start: 2016-06-04 — End: 2016-10-10

## 2016-06-04 NOTE — Telephone Encounter (Signed)
Fluoxetine Rx sent.

## 2016-06-04 NOTE — Telephone Encounter (Signed)
Pls eRx fluoxetine 20 mg qd, #30, RF x 3.

## 2016-06-04 NOTE — Telephone Encounter (Signed)
Patient states he does not currently have insurance. He went to [facility] to get venlafaxine [facility] refill. It was $500. Is there something else he can take?

## 2016-10-10 ENCOUNTER — Encounter: Payer: Self-pay | Admitting: Family Medicine

## 2016-10-10 ENCOUNTER — Ambulatory Visit (INDEPENDENT_AMBULATORY_CARE_PROVIDER_SITE_OTHER): Payer: BLUE CROSS/BLUE SHIELD | Admitting: Family Medicine

## 2016-10-10 VITALS — BP 128/73 | HR 95 | Temp 97.9°F | Resp 16 | Ht 67.0 in | Wt 194.5 lb

## 2016-10-10 DIAGNOSIS — M545 Low back pain, unspecified: Secondary | ICD-10-CM

## 2016-10-10 DIAGNOSIS — G8929 Other chronic pain: Secondary | ICD-10-CM

## 2016-10-10 MED ORDER — DICLOFENAC SODIUM 75 MG PO TBEC: DELAYED_RELEASE_TABLET | ORAL | 0 refills | Status: DC

## 2016-10-10 MED ORDER — CYCLOBENZAPRINE HCL 10 MG PO TABS: 10.0000 mg | ORAL_TABLET | Freq: Three times a day (TID) | ORAL | 1 refills | Status: DC | PRN
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
## 2016-10-10 NOTE — Progress Notes (Signed)
Pre visit review using our clinic review tool, if applicable. No additional management support is needed unless otherwise documented below in the visit note. 

## 2017-01-28 ENCOUNTER — Ambulatory Visit (INDEPENDENT_AMBULATORY_CARE_PROVIDER_SITE_OTHER): Payer: BLUE CROSS/BLUE SHIELD | Admitting: Family Medicine

## 2017-01-28 ENCOUNTER — Encounter: Payer: Self-pay | Admitting: Family Medicine

## 2017-01-28 VITALS — BP 112/72 | HR 73 | Temp 98.1°F | Resp 16 | Ht 67.0 in | Wt 195.5 lb

## 2017-01-28 DIAGNOSIS — S39012A Strain of muscle, fascia and tendon of lower back, initial encounter: Secondary | ICD-10-CM | POA: Diagnosis not present

## 2017-01-28 MED ORDER — CYCLOBENZAPRINE HCL 10 MG PO TABS: 10.0000 mg | ORAL_TABLET | Freq: Three times a day (TID) | ORAL | 0 refills | Status: DC | PRN

## 2017-01-28 NOTE — Patient Instructions (Signed)
Take 3 over the counter (200 mg tabs) TWICE per day with food for 10 days.  Do back stretches and apply heat.   Low Back Sprain Rehab Ask your health care provider which exercises are safe for you. Do exercises exactly as told by your health care provider and adjust them as directed. It is normal to feel mild stretching, pulling, tightness, or discomfort as you do these exercises, but you should stop right away if you feel sudden pain or your pain gets worse. Do not begin these exercises until told by your health care provider. Stretching and range of motion exercises These exercises warm up your muscles and joints and improve the movement and flexibility of your back. These exercises also help to relieve pain, numbness, and tingling. Exercise A: Lumbar rotation   1. Lie on your back on a firm surface and bend your knees. 2. Straighten your arms out to your sides so each arm forms an "L" shape with a side of your body (a 90 degree angle). 3. Slowly move both of your knees to one side of your body until you feel a stretch in your lower back. Try not to let your shoulders move off of the floor. 4. Hold for __________ seconds. 5. Tense your abdominal muscles and slowly move your knees back to the starting position. 6. Repeat this exercise on the other side of your body. Repeat __________ times. Complete this exercise __________ times a day. Exercise B: Prone extension on elbows   1. Lie on your abdomen on a firm surface. 2. Prop yourself up on your elbows. 3. Use your arms to help lift your chest up until you feel a gentle stretch in your abdomen and your lower back.  This will place some of your body weight on your elbows. If this is uncomfortable, try stacking pillows under your chest.  Your hips should stay down, against the surface that you are lying on. Keep your hip and back muscles relaxed. 4. Hold for __________ seconds. 5. Slowly relax your upper body and return to the starting  position. Repeat __________ times. Complete this exercise __________ times a day. Strengthening exercises These exercises build strength and endurance in your back. Endurance is the ability to use your muscles for a long time, even after they get tired. Exercise C: Pelvic tilt  1. Lie on your back on a firm surface. Bend your knees and keep your feet flat. 2. Tense your abdominal muscles. Tip your pelvis up toward the ceiling and flatten your lower back into the floor.  To help with this exercise, you may place a small towel under your lower back and try to push your back into the towel. 3. Hold for __________ seconds. 4. Let your muscles relax completely before you repeat this exercise. Repeat __________ times. Complete this exercise __________ times a day. Exercise D: Alternating arm and leg raises   1. Get on your hands and knees on a firm surface. If you are on a hard floor, you may want to use padding to cushion your knees, such as an exercise mat. 2. Line up your arms and legs. Your hands should be below your shoulders, and your knees should be below your hips. 3. Lift your left leg behind you. At the same time, raise your right arm and straighten it in front of you.  Do not lift your leg higher than your hip.  Do not lift your arm higher than your shoulder.  Keep your abdominal and  back muscles tight.  Keep your hips facing the ground.  Do not arch your back.  Keep your balance carefully, and do not hold your breath. 4. Hold for __________ seconds. 5. Slowly return to the starting position and repeat with your right leg and your left arm. Repeat __________ times. Complete this exercise __________ times a day. Exercise E: Abdominal set with straight leg raise   1. Lie on your back on a firm surface. 2. Bend one of your knees and keep your other leg straight. 3. Tense your abdominal muscles and lift your straight leg up, 4-6 inches (10-15 cm) off the ground. 4. Keep your  abdominal muscles tight and hold for __________ seconds.  Do not hold your breath.  Do not arch your back. Keep it flat against the ground. 5. Keep your abdominal muscles tense as you slowly lower your leg back to the starting position. 6. Repeat with your other leg. Repeat __________ times. Complete this exercise __________ times a day. Posture and body mechanics   Body mechanics refers to the movements and positions of your body while you do your daily activities. Posture is part of body mechanics. Good posture and healthy body mechanics can help to relieve stress in your body's tissues and joints. Good posture means that your spine is in its natural S-curve position (your spine is neutral), your shoulders are pulled back slightly, and your head is not tipped forward. The following are general guidelines for applying improved posture and body mechanics to your everyday activities. Standing    When standing, keep your spine neutral and your feet about hip-width apart. Keep a slight bend in your knees. Your ears, shoulders, and hips should line up.  When you do a task in which you stand in one place for a long time, place one foot up on a stable object that is 2-4 inches (5-10 cm) high, such as a footstool. This helps keep your spine neutral. Sitting    When sitting, keep your spine neutral and keep your feet flat on the floor. Use a footrest, if necessary, and keep your thighs parallel to the floor. Avoid rounding your shoulders, and avoid tilting your head forward.  When working at a desk or a computer, keep your desk at a height where your hands are slightly lower than your elbows. Slide your chair under your desk so you are close enough to maintain good posture.  When working at a computer, place your monitor at a height where you are looking straight ahead and you do not have to tilt your head forward or downward to look at the screen. Resting    When lying down and resting, avoid  positions that are most painful for you.  If you have pain with activities such as sitting, bending, stooping, or squatting (flexion-based activities), lie in a position in which your body does not bend very much. For example, avoid curling up on your side with your arms and knees near your chest (fetal position).  If you have pain with activities such as standing for a long time or reaching with your arms (extension-based activities), lie with your spine in a neutral position and bend your knees slightly. Try the following positions:  Lying on your side with a pillow between your knees.  Lying on your back with a pillow under your knees. Lifting    When lifting objects, keep your feet at least shoulder-width apart and tighten your abdominal muscles.  Bend your knees and hips  and keep your spine neutral. It is important to lift using the strength of your legs, not your back. Do not lock your knees straight out.  Always ask for help to lift heavy or awkward objects. This information is not intended to replace advice given to you by your health care provider. Make sure you discuss any questions you have with your health care provider. Document Released: 09/01/2005 Document Revised: 05/08/2016 Document Reviewed: 06/13/2015 Elsevier Interactive Patient Education  2017 [employer]
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
## 2017-03-10 ENCOUNTER — Ambulatory Visit (INDEPENDENT_AMBULATORY_CARE_PROVIDER_SITE_OTHER): Payer: BLUE CROSS/BLUE SHIELD | Admitting: Family Medicine

## 2017-03-10 ENCOUNTER — Encounter: Payer: Self-pay | Admitting: Family Medicine

## 2017-03-10 VITALS — BP 116/75 | HR 94 | Temp 98.3°F | Resp 20 | Wt 198.2 lb

## 2017-03-10 DIAGNOSIS — H00012 Hordeolum externum right lower eyelid: Secondary | ICD-10-CM | POA: Diagnosis not present

## 2017-03-10 MED ORDER — AZITHROMYCIN 250 MG PO TABS: ORAL_TABLET | ORAL | 0 refills | Status: DC

## 2017-03-10 MED ORDER — ERYTHROMYCIN 5 MG/GM OP OINT: 1.0000 "application " | TOPICAL_OINTMENT | Freq: Every day | OPHTHALMIC | 0 refills | Status: DC

## 2017-03-10 NOTE — Patient Instructions (Addendum)
Stye A stye is a bump on your eyelid caused by a bacterial infection. A stye can form inside the eyelid (internal stye) or outside the eyelid (external stye). An internal stye may be caused by an infected oil-producing gland inside your eyelid. An external stye may be caused by an infection at the base of your eyelash (hair follicle). Styes are very common. Anyone can get them at any age. They usually occur in just one eye, but you may have more than one in either eye. What are the causes? The infection is almost always caused by bacteria called Staphylococcus aureus. This is a common type of bacteria that lives on your skin. What increases the risk? You may be at higher risk for a stye if you have had one before. You may also be at higher risk if you have:  Diabetes.  Long-term illness.  Long-term eye redness.  A skin condition called seborrhea.  High fat levels in your blood (lipids).  What are the signs or symptoms? Eyelid pain is the most common symptom of a stye. Internal styes are more painful than external styes. Other signs and symptoms may include:  Painful swelling of your eyelid.  A scratchy feeling in your eye.  Tearing and redness of your eye.  Pus draining from the stye.  How is this diagnosed? Your health care provider may be able to diagnose a stye just by examining your eye. The health care provider may also check to make sure:  You do not have a fever or other signs of a more serious infection.  The infection has not spread to other parts of your eye or areas around your eye.  How is this treated? Most styes will clear up in a few days without treatment. In some cases, you may need to use antibiotic drops or ointment to prevent infection. Your health care provider may have to drain the stye surgically if your stye is:  Large.  Causing a lot of pain.  Interfering with your vision.  This can be done using a thin blade or a needle. Follow these  instructions at home:  Take medicines only as directed by your health care provider.  Apply a clean, warm compress to your eye for 10 minutes, 4 times a day.  Do not wear contact lenses or eye makeup until your stye has healed.  Do not try to pop or drain the stye. Contact a health care provider if:  You have chills or a fever.  Your stye does not go away after several days.  Your stye affects your vision.  Your eyeball becomes swollen, red, or painful. This information is not intended to replace advice given to you by your health care provider. Make sure you discuss any questions you have with your health care provider. Document Released: 06/11/2005 Document Revised: 04/27/2016 Document Reviewed: 12/16/2013 Elsevier Interactive Patient Education  2018 [employer] Inc[employer]   Warm compresses 2-3 times a day for 15 minutes. Johnson and johnson baby shampoo (no tears) gentle cleansing  Prior to warm compress soaks.  Z-pack and eye ointment daily until resolved.
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
## 2017-04-12 DIAGNOSIS — R1013 Epigastric pain: Secondary | ICD-10-CM | POA: Diagnosis not present

## 2017-04-12 DIAGNOSIS — R1084 Generalized abdominal pain: Secondary | ICD-10-CM | POA: Diagnosis not present

## 2017-04-12 DIAGNOSIS — Z9089 Acquired absence of other organs: Secondary | ICD-10-CM | POA: Diagnosis not present

## 2017-04-12 DIAGNOSIS — F17290 Nicotine dependence, other tobacco product, uncomplicated: Secondary | ICD-10-CM | POA: Diagnosis not present

## 2017-04-12 DIAGNOSIS — R350 Frequency of micturition: Secondary | ICD-10-CM | POA: Diagnosis not present

## 2017-04-12 DIAGNOSIS — R1031 Right lower quadrant pain: Secondary | ICD-10-CM | POA: Diagnosis not present

## 2017-04-12 DIAGNOSIS — E785 Hyperlipidemia, unspecified: Secondary | ICD-10-CM | POA: Diagnosis not present

## 2017-04-12 DIAGNOSIS — Z720 Tobacco use: Secondary | ICD-10-CM | POA: Diagnosis not present

## 2017-04-14 DIAGNOSIS — R1013 Epigastric pain: Secondary | ICD-10-CM | POA: Diagnosis not present

## 2017-04-14 DIAGNOSIS — R1084 Generalized abdominal pain: Secondary | ICD-10-CM | POA: Diagnosis not present

## 2017-04-14 DIAGNOSIS — Z791 Long term (current) use of non-steroidal anti-inflammatories (NSAID): Secondary | ICD-10-CM | POA: Diagnosis not present

## 2017-04-17 DIAGNOSIS — K2950 Unspecified chronic gastritis without bleeding: Secondary | ICD-10-CM | POA: Diagnosis not present

## 2017-04-17 DIAGNOSIS — K2970 Gastritis, unspecified, without bleeding: Secondary | ICD-10-CM | POA: Diagnosis not present

## 2017-04-17 DIAGNOSIS — R1013 Epigastric pain: Secondary | ICD-10-CM | POA: Diagnosis not present

## 2017-04-17 DIAGNOSIS — K259 Gastric ulcer, unspecified as acute or chronic, without hemorrhage or perforation: Secondary | ICD-10-CM | POA: Diagnosis not present

## 2017-07-06 DIAGNOSIS — Z23 Encounter for immunization: Secondary | ICD-10-CM | POA: Diagnosis not present

## 2017-07-20 ENCOUNTER — Ambulatory Visit (INDEPENDENT_AMBULATORY_CARE_PROVIDER_SITE_OTHER): Payer: BLUE CROSS/BLUE SHIELD | Admitting: Family Medicine

## 2017-07-20 ENCOUNTER — Encounter: Payer: Self-pay | Admitting: Family Medicine

## 2017-07-20 VITALS — BP 113/68 | HR 83 | Temp 98.1°F | Resp 16 | Ht 66.5 in | Wt 197.0 lb

## 2017-07-20 DIAGNOSIS — Z0000 Encounter for general adult medical examination without abnormal findings: Secondary | ICD-10-CM | POA: Diagnosis not present

## 2017-07-20 DIAGNOSIS — G43909 Migraine, unspecified, not intractable, without status migrainosus: Secondary | ICD-10-CM

## 2017-07-20 DIAGNOSIS — F329 Major depressive disorder, single episode, unspecified: Secondary | ICD-10-CM | POA: Insufficient documentation

## 2017-07-20 DIAGNOSIS — F32A Depression, unspecified: Secondary | ICD-10-CM | POA: Insufficient documentation

## 2017-07-20 DIAGNOSIS — E785 Hyperlipidemia, unspecified: Secondary | ICD-10-CM | POA: Insufficient documentation

## 2017-07-20 LAB — COMPREHENSIVE METABOLIC PANEL
ALT: 74 U/L — ABNORMAL HIGH (ref 0–53)
AST: 52 U/L — ABNORMAL HIGH (ref 0–37)
Albumin: 4.3 g/dL (ref 3.5–5.2)
Alkaline Phosphatase: 66 U/L (ref 39–117)
BUN: 8 mg/dL (ref 6–23)
CO2: 27 mEq/L (ref 19–32)
Calcium: 9.5 mg/dL (ref 8.4–10.5)
Chloride: 105 mEq/L (ref 96–112)
Creatinine, Ser: 0.91 mg/dL (ref 0.40–1.50)
GFR: 100.43 mL/min (ref >60.00)
Glucose, Bld: 89 mg/dL (ref 70–99)
Potassium: 4.4 mEq/L (ref 3.5–5.1)
Sodium: 139 mEq/L (ref 135–145)
Total Bilirubin: 0.4 mg/dL (ref 0.2–1.2)
Total Protein: 6.4 g/dL (ref 6.0–8.3)

## 2017-07-20 LAB — CBC WITH DIFFERENTIAL/PLATELET
Basophils Absolute: 0 10*3/uL (ref 0.0–0.1)
Basophils Relative: 0.3 % (ref 0.0–3.0)
Eosinophils Absolute: 0.2 10*3/uL (ref 0.0–0.7)
Eosinophils Relative: 1.2 % (ref 0.0–5.0)
HCT: 47.1 % (ref 39.0–52.0)
Hemoglobin: 15.5 g/dL (ref 13.0–17.0)
Lymphocytes Relative: 18.7 % (ref 12.0–46.0)
Lymphs Abs: 2.4 10*3/uL (ref 0.7–4.0)
MCHC: 32.9 g/dL (ref 30.0–36.0)
MCV: 93.5 fl (ref 78.0–100.0)
Monocytes Absolute: 0.9 10*3/uL (ref 0.1–1.0)
Monocytes Relative: 6.7 % (ref 3.0–12.0)
Neutro Abs: 9.5 10*3/uL — ABNORMAL HIGH (ref 1.4–7.7)
Neutrophils Relative %: 73.1 % (ref 43.0–77.0)
Platelets: 279 10*3/uL (ref 150.0–400.0)
RBC: 5.03 Mil/uL (ref 4.22–5.81)
RDW: 14.5 % (ref 11.5–15.5)
WBC: 13 10*3/uL — ABNORMAL HIGH (ref 4.0–10.5)

## 2017-07-20 LAB — LIPID PANEL
Cholesterol: 219 mg/dL — ABNORMAL HIGH (ref 0–200)
HDL: 36.2 mg/dL — ABNORMAL LOW (ref >39.00)
LDL Cholesterol: 157 mg/dL — ABNORMAL HIGH (ref 0–99)
NonHDL: 182.61
Total CHOL/HDL Ratio: 6
Triglycerides: 129 mg/dL (ref 0.0–149.0)
VLDL: 25.8 mg/dL (ref 0.0–40.0)

## 2017-07-20 LAB — TSH: TSH: 0.6 u[IU]/mL (ref 0.35–4.50)

## 2017-07-20 MED ORDER — RIZATRIPTAN BENZOATE 10 MG PO TABS: 10.0000 mg | ORAL_TABLET | ORAL | 0 refills | Status: DC | PRN

## 2017-07-20 NOTE — Patient Instructions (Signed)
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
## 2017-07-21 ENCOUNTER — Other Ambulatory Visit: Payer: Self-pay | Admitting: Family Medicine

## 2017-07-21 ENCOUNTER — Other Ambulatory Visit: Payer: BLUE CROSS/BLUE SHIELD

## 2017-07-21 DIAGNOSIS — R7401 Elevation of levels of liver transaminase levels: Secondary | ICD-10-CM

## 2017-07-21 DIAGNOSIS — R740 Nonspecific elevation of levels of transaminase and lactic acid dehydrogenase [LDH]: Principal | ICD-10-CM

## 2017-07-22 ENCOUNTER — Other Ambulatory Visit: Payer: Self-pay | Admitting: *Deleted

## 2017-07-22 DIAGNOSIS — R7401 Elevation of levels of liver transaminase levels: Secondary | ICD-10-CM

## 2017-07-22 DIAGNOSIS — R740 Nonspecific elevation of levels of transaminase and lactic acid dehydrogenase [LDH]: Secondary | ICD-10-CM

## 2017-07-22 DIAGNOSIS — D72829 Elevated white blood cell count, unspecified: Secondary | ICD-10-CM

## 2017-07-22 LAB — HEPATITIS B SURFACE ANTIGEN: Hepatitis B Surface Ag: NONREACTIVE

## 2017-07-22 LAB — HEPATITIS C ANTIBODY
Hepatitis C Ab: NONREACTIVE
SIGNAL TO CUT-OFF: 0.01 (ref <1.00)

## 2017-07-29 ENCOUNTER — Ambulatory Visit: Payer: BLUE CROSS/BLUE SHIELD | Admitting: Family Medicine

## 2017-07-29 ENCOUNTER — Other Ambulatory Visit: Payer: Self-pay

## 2017-07-29 ENCOUNTER — Encounter: Payer: Self-pay | Admitting: Family Medicine

## 2017-07-29 VITALS — BP 109/73 | HR 75 | Temp 97.8°F | Resp 16 | Ht 66.5 in | Wt 197.0 lb

## 2017-07-29 DIAGNOSIS — R350 Frequency of micturition: Secondary | ICD-10-CM

## 2017-07-29 DIAGNOSIS — G43909 Migraine, unspecified, not intractable, without status migrainosus: Secondary | ICD-10-CM | POA: Diagnosis not present

## 2017-07-29 DIAGNOSIS — R8290 Unspecified abnormal findings in urine: Secondary | ICD-10-CM

## 2017-07-29 DIAGNOSIS — N528 Other male erectile dysfunction: Secondary | ICD-10-CM

## 2017-07-29 LAB — POCT URINALYSIS DIPSTICK
Bilirubin, UA: NEGATIVE
Blood, UA: NEGATIVE
Glucose, UA: NEGATIVE
Ketones, UA: NEGATIVE
Leukocytes, UA: NEGATIVE
Nitrite, UA: NEGATIVE
Spec Grav, UA: 1.02 (ref 1.010–1.025)
Urobilinogen, UA: 1 E.U./dL
pH, UA: 7.5 (ref 5.0–8.0)
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
## 2017-08-03 ENCOUNTER — Ambulatory Visit: Payer: BLUE CROSS/BLUE SHIELD | Admitting: Family Medicine

## 2017-11-24 ENCOUNTER — Ambulatory Visit (INDEPENDENT_AMBULATORY_CARE_PROVIDER_SITE_OTHER): Payer: BLUE CROSS/BLUE SHIELD | Admitting: Family Medicine

## 2017-11-24 ENCOUNTER — Encounter: Payer: Self-pay | Admitting: Family Medicine

## 2017-11-24 VITALS — BP 122/77 | HR 93 | Temp 98.4°F | Resp 20 | Ht 66.5 in | Wt 190.1 lb

## 2017-11-24 DIAGNOSIS — J069 Acute upper respiratory infection, unspecified: Secondary | ICD-10-CM

## 2017-11-24 DIAGNOSIS — R6889 Other general symptoms and signs: Secondary | ICD-10-CM | POA: Diagnosis not present

## 2017-11-24 LAB — POC INFLUENZA A&B (BINAX/QUICKVUE)
Influenza A, POC: NEGATIVE
Influenza B, POC: NEGATIVE

## 2017-11-24 MED ORDER — CEFDINIR 300 MG PO CAPS: 300.0000 mg | ORAL_CAPSULE | Freq: Two times a day (BID) | ORAL | 0 refills | Status: DC

## 2017-11-24 NOTE — Patient Instructions (Signed)
Negative flu test today. This is likely viral and will continue to improve over next 4 days. If symptoms start to worsen it can be a bacteria infection starting. Therefore, I have printed a script for an antibiotic for you to start towards the ned of week if symptoms worsening.  Rest, hydrate.  + flonase, mucinex (DM if cough), nettie pot or nasal saline.  If cough present it can last up to 6-8 weeks.  F/U 2 weeks of not improved.     Upper Respiratory Infection, Adult Most upper respiratory infections (URIs) are caused by a virus. A URI affects the nose, throat, and upper air passages. The most common type of URI is often called "the common cold." Follow these instructions at home:  Take medicines only as told by your doctor.  Gargle warm saltwater or take cough drops to comfort your throat as told by your doctor.  Use a warm mist humidifier or inhale steam from a shower to increase air moisture. This may make it easier to breathe.  Drink enough fluid to keep your pee (urine) clear or pale yellow.  Eat soups and other clear broths.  Have a healthy diet.  Rest as needed.  Go back to work when your fever is gone or your doctor says it is okay. ? You may need to stay home longer to avoid giving your URI to others. ? You can also wear a face mask and wash your hands often to prevent spread of the virus.  Use your inhaler more if you have asthma.  Do not use any tobacco products, including cigarettes, chewing tobacco, or electronic cigarettes. If you need help quitting, ask your doctor. Contact a doctor if:  You are getting worse, not better.  Your symptoms are not helped by medicine.  You have chills.  You are getting more short of breath.  You have brown or red mucus.  You have yellow or brown discharge from your nose.  You have pain in your face, especially when you bend forward.  You have a fever.  You have puffy (swollen) neck glands.  You have pain while  swallowing.  You have white areas in the back of your throat. Get help right away if:  You have very bad or constant: ? Headache. ? Ear pain. ? Pain in your forehead, behind your eyes, and over your cheekbones (sinus pain). ? Chest pain.  You have long-lasting (chronic) lung disease and any of the following: ? Wheezing. ? Long-lasting cough. ? Coughing up blood. ? A change in your usual mucus.  You have a stiff neck.  You have changes in your: ? Vision. ? Hearing. ? Thinking. ? Mood. This information is not intended to replace advice given to you by your health care provider. Make sure you discuss any questions you have with your health care provider. Document Released: 02/18/2008 Document Revised: 05/04/2016 Document Reviewed: 12/07/2013 Elsevier Interactive Patient Education  2018 [employer]
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
## 2017-12-11 ENCOUNTER — Encounter: Payer: Self-pay | Admitting: Family Medicine

## 2017-12-11 ENCOUNTER — Ambulatory Visit (INDEPENDENT_AMBULATORY_CARE_PROVIDER_SITE_OTHER): Payer: BLUE CROSS/BLUE SHIELD | Admitting: Family Medicine

## 2017-12-11 VITALS — BP 109/69 | HR 110 | Temp 98.7°F | Resp 16 | Ht 66.5 in | Wt 193.4 lb

## 2017-12-11 DIAGNOSIS — H6982 Other specified disorders of Eustachian tube, left ear: Secondary | ICD-10-CM

## 2017-12-11 MED ORDER — FLUTICASONE PROPIONATE 50 MCG/ACT NA SUSP: 2.0000 | Freq: Every day | NASAL | 4 refills | Status: DC

## 2017-12-11 NOTE — Patient Instructions (Signed)
Continue to use 2-3 sprays of saline nasal spray in each nostril 2-3 times per day to irrigate and moisturize your nasal passages.  Use the flonase (fluticasone) nasal spray once every day for at least 1 month.
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
## 2018-01-11 ENCOUNTER — Telehealth: Payer: Self-pay | Admitting: *Deleted

## 2018-01-11 ENCOUNTER — Ambulatory Visit (INDEPENDENT_AMBULATORY_CARE_PROVIDER_SITE_OTHER): Payer: BLUE CROSS/BLUE SHIELD | Admitting: Family Medicine

## 2018-01-11 ENCOUNTER — Encounter: Payer: Self-pay | Admitting: Family Medicine

## 2018-01-11 ENCOUNTER — Other Ambulatory Visit (HOSPITAL_COMMUNITY)
Admission: RE | Admit: 2018-01-11 | Discharge: 2018-01-11 | Disposition: A | Discharge status: Home / Self Care | Payer: BLUE CROSS/BLUE SHIELD | Source: Ambulatory Visit | Attending: Family Medicine | Admitting: Family Medicine

## 2018-01-11 VITALS — BP 127/80 | HR 77 | Temp 98.1°F | Resp 20 | Ht 66.5 in | Wt 189.0 lb

## 2018-01-11 DIAGNOSIS — N489 Disorder of penis, unspecified: Secondary | ICD-10-CM | POA: Insufficient documentation

## 2018-01-11 DIAGNOSIS — N202 Calculus of kidney with calculus of ureter: Secondary | ICD-10-CM | POA: Insufficient documentation

## 2018-01-11 DIAGNOSIS — Z202 Contact with and (suspected) exposure to infections with a predominantly sexual mode of transmission: Secondary | ICD-10-CM | POA: Diagnosis not present

## 2018-01-11 MED ORDER — MUPIROCIN CALCIUM 2 % EX CREA: 1.0000 "application " | TOPICAL_CREAM | Freq: Two times a day (BID) | CUTANEOUS | 0 refills | Status: DC
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
## 2018-01-11 NOTE — Patient Instructions (Signed)
Apply cream to area twice a day. Once all labs returned I will call you with results.    Safe Sex Practicing safe sex means taking steps before and during sex to reduce your risk of:  Getting an STD (sexually transmitted disease).  Giving your partner an STD.  Unwanted pregnancy.  How can I practice safe sex?  To practice safe sex:  Limit your sexual partners to only one partner who is having sex with only you.  Avoid using alcohol and recreational drugs before having sex. These substances can affect your judgment.  Before having sex with a new partner: ? Talk to your partner about past partners, past STDs, and drug use. ? You and your partner should be screened for STDs and discuss the results with each other.  Check your body regularly for sores, blisters, rashes, or unusual discharge. If you notice any of these problems, visit your health care provider.  If you have symptoms of an infection or you are being treated for an STD, avoid sexual contact.  While having sex, use a condom. Make sure to: ? Use a condom every time you have vaginal, oral, or anal sex. Both females and males should wear condoms during oral sex. ? Keep condoms in place from the beginning to the end of sexual activity. ? Use a latex condom, if possible. Latex condoms offer the best protection. ? Use only water-based lubricants or oils to lubricate a condom. Using petroleum-based lubricants or oils will weaken the condom and increase the chance that it will break.  See your health care provider for regular screenings, exams, and tests for STDs.  Talk with your health care provider about the form of birth control (contraception) that is best for you.  Get vaccinated against hepatitis B and human papillomavirus (HPV).  If you are at risk of being infected with HIV (human immunodeficiency virus), talk with your health care provider about taking a prescription medicine to prevent HIV infection. You are  considered at risk for HIV if: ? You are a man who has sex with other men. ? You are a heterosexual man or woman who is sexually active with more than one partner. ? You take drugs by injection. ? You are sexually active with a partner who has HIV.  This information is not intended to replace advice given to you by your health care provider. Make sure you discuss any questions you have with your health care provider. Document Released: 10/09/2004 Document Revised: 01/16/2016 Document Reviewed: 07/22/2015 Elsevier Interactive Patient Education  [employer].

## 2018-01-11 NOTE — Telephone Encounter (Signed)
Copied from CRM [id number]. Topic: General - Other >> Jan 11, 2018  3:04 PM Percival Spanish wrote:  Garrett Hull with [facility] call to say the following med cost 201.00  the ointment is cheaper and is asking for a RX  for  the ointment   mupirocin cream (BACTROBAN) 2 %  Notified pharmacy ok to change to ointment per Dr Claiborne Billings

## 2018-01-11 NOTE — Telephone Encounter (Signed)
Whichever is fine.

## 2018-01-12 LAB — RPR: RPR Ser Ql: NONREACTIVE

## 2018-01-12 LAB — URINE CYTOLOGY ANCILLARY ONLY
Chlamydia: NEGATIVE
Neisseria Gonorrhea: NEGATIVE

## 2018-01-12 LAB — HIV ANTIBODY (ROUTINE TESTING W REFLEX): HIV 1&2 Ab, 4th Generation: NONREACTIVE

## 2018-01-13 ENCOUNTER — Telehealth: Payer: Self-pay | Admitting: Family Medicine

## 2018-01-13 NOTE — Telephone Encounter (Signed)
Copied from CRM [id number]. Topic: Quick Communication - See Telephone Encounter >> Jan 13, 2018  1:02 PM Eston Mould B wrote: CRM for notification. See Telephone encounter for: 01/13/18. PT is calling for lab results

## 2018-01-13 NOTE — Telephone Encounter (Signed)
Patient medication was changed to ointment yesterday. Informed patient,

## 2018-01-13 NOTE — Telephone Encounter (Signed)
Copied from CRM [id number]. Topic: Quick Communication - See Telephone Encounter >> Jan 13, 2018  1:04 PM Eston Mould B wrote: CRM for notification. See Telephone encounter for: 01/13/18. PT states the pharmacy told him his insurance does not cover mupirocin cream (BACTROBAN) 2 %  and he is asking for a rx for a different medication  [id number] - [place], [place] - [address] [phone] (Phone) [phone] (Fax)

## 2018-01-14 ENCOUNTER — Telehealth: Payer: Self-pay | Admitting: Family Medicine

## 2018-01-14 LAB — HSV(HERPES SMPLX)ABS-I+II(IGG+IGM)-BLD
HSV 1 Glycoprotein G Ab, IgG: <0.91 index (ref 0.00–0.90)
HSV 2 IgG, Type Spec: <0.91 index (ref 0.00–0.90)
HSVI/II Comb IgM: 0.93 Ratio — ABNORMAL HIGH (ref 0.00–0.90)

## 2018-01-14 MED ORDER — VALACYCLOVIR HCL 1 G PO TABS: 1000.0000 mg | ORAL_TABLET | Freq: Two times a day (BID) | ORAL | 0 refills | Status: DC

## 2018-01-14 NOTE — Telephone Encounter (Signed)
Please inform patient the following information: Was not called earlier because his lab results were not completed until this morning those labs take a while to run. All of his labs are normal with the exception of an equivocal HSV-2 IgM.   The titer was not low enough to be negative and not high enough to be positive = equivocal. This means that he very well could be an early stages of being exposed to herpes simplex-2.  For this reason I have called in an antiviral called Valtrex for him to take as label reads. I would recommend he follow-up with his PCP in 2 months, and retesting can be done at that time to verify if positive or negative.  He should wait 2 months, it takes that long for the body to make the antibodies we are testing . He should be encouraged to wear condoms, and avoid sex counters if any ulceration is visible.

## 2018-01-14 NOTE — Telephone Encounter (Signed)
Spoke with patient reviewed lab results and instructions. Patient verbalized understanding. 

## 2018-03-12 ENCOUNTER — Other Ambulatory Visit: Payer: Self-pay | Admitting: Family Medicine

## 2018-03-12 ENCOUNTER — Ambulatory Visit (INDEPENDENT_AMBULATORY_CARE_PROVIDER_SITE_OTHER): Payer: BLUE CROSS/BLUE SHIELD

## 2018-03-12 ENCOUNTER — Encounter: Payer: Self-pay | Admitting: Family Medicine

## 2018-03-12 ENCOUNTER — Ambulatory Visit (INDEPENDENT_AMBULATORY_CARE_PROVIDER_SITE_OTHER): Payer: BLUE CROSS/BLUE SHIELD | Admitting: Family Medicine

## 2018-03-12 VITALS — BP 130/90 | HR 84 | Temp 98.5°F | Ht 66.5 in | Wt 187.4 lb

## 2018-03-12 DIAGNOSIS — S4991XA Unspecified injury of right shoulder and upper arm, initial encounter: Secondary | ICD-10-CM

## 2018-03-12 DIAGNOSIS — S99911A Unspecified injury of right ankle, initial encounter: Secondary | ICD-10-CM

## 2018-03-12 DIAGNOSIS — S93401A Sprain of unspecified ligament of right ankle, initial encounter: Secondary | ICD-10-CM | POA: Diagnosis not present

## 2018-03-12 DIAGNOSIS — M25511 Pain in right shoulder: Secondary | ICD-10-CM | POA: Diagnosis not present

## 2018-03-12 MED ORDER — DICLOFENAC SODIUM 1 % TD GEL: 2.0000 g | Freq: Four times a day (QID) | TRANSDERMAL | 1 refills | Status: DC

## 2018-03-12 MED ORDER — TRAMADOL HCL 50 MG PO TABS: 50.0000 mg | ORAL_TABLET | Freq: Three times a day (TID) | ORAL | 0 refills | Status: DC | PRN
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
## 2018-03-12 NOTE — Patient Instructions (Signed)
Shoulder Impingement Syndrome Shoulder impingement syndrome is a condition that causes pain when connective tissues (tendons) surrounding the shoulder joint become pinched. These tendons are part of the group of muscles and tissues that help to stabilize the shoulder (rotator cuff). Beneath the rotator cuff is a fluid-filled sac (bursa) that allows the muscles and tendons to glide smoothly. The bursa may become swollen or irritated (bursitis). Bursitis, swelling in the rotator cuff tendons, or both conditions can decrease how much space is under a bone in the shoulder joint (acromion), resulting in impingement. What are the causes? Shoulder impingement syndrome can be caused by bursitis or swelling of the rotator cuff tendons, which may result from:  Repetitive overhead arm movements.  Falling onto the shoulder.  Weakness in the shoulder muscles.  What increases the risk? You may be more likely to develop this condition if you are an athlete who participates in:  Sports that involve throwing, such as baseball.  Tennis.  Swimming.  Volleyball.  Some people are also more likely to develop impingement syndrome because of the shape of their acromion bone. What are the signs or symptoms? The main symptom of this condition is pain on the front or side of the shoulder. Pain may:  Get worse when lifting or raising the arm.  Get worse at night.  Wake you up from sleeping.  Feel sharp when the shoulder is moved, and then fade to an ache.  Other signs and symptoms may include:  Tenderness.  Stiffness.  Inability to raise the arm above shoulder level or behind the body.  Weakness.  How is this diagnosed? This condition may be diagnosed based on:  Your symptoms.  Your medical history.  A physical exam.  Imaging tests, such as: ? X-rays. ? MRI. ? Ultrasound.  How is this treated? Treatment for this condition may include:  Resting your shoulder and avoiding all  activities that cause pain or put stress on the shoulder.  Icing your shoulder.  NSAIDs to help reduce pain and swelling.  One or more injections of medicines to numb the area and reduce inflammation.  Physical therapy.  Surgery. This may be needed if nonsurgical treatments have not helped. Surgery may involve repairing the rotator cuff, reshaping the acromion, or removing the bursa.  Follow these instructions at home: Managing pain, stiffness, and swelling  If directed, apply ice to the injured area. ? Put ice in a plastic bag. ? Place a towel between your skin and the bag. ? Leave the ice on for 20 minutes, 2-3 times a day. Activity  Rest and return to your normal activities as told by your health care provider. Ask your health care provider what activities are safe for you.  Do exercises as told by your health care provider. General instructions  Do not use any tobacco products, including cigarettes, chewing tobacco, or e-cigarettes. Tobacco can delay healing. If you need help quitting, ask your health care provider.  Ask your health care provider when it is safe for you to drive.  Take over-the-counter and prescription medicines only as told by your health care provider.  Keep all follow-up visits as told by your health care provider. This is important. How is this prevented?  Give your body time to rest between periods of activity.  Be safe and responsible while being active to avoid falls.  Maintain physical fitness, including strength and flexibility. Contact a health care provider if:  Your symptoms have not improved after 1-2 months of treatment and   rest.  You cannot lift your arm away from your body. This information is not intended to replace advice given to you by your health care provider. Make sure you discuss any questions you have with your health care provider. Document Released: 09/01/2005 Document Revised: 05/08/2016 Document Reviewed:  08/04/2015 Elsevier Interactive Patient Education  2018 Elsevier Inc.  

## 2018-03-31 ENCOUNTER — Ambulatory Visit: Payer: BLUE CROSS/BLUE SHIELD | Admitting: Sports Medicine

## 2018-04-01 ENCOUNTER — Ambulatory Visit: Payer: Self-pay

## 2018-04-01 ENCOUNTER — Ambulatory Visit (INDEPENDENT_AMBULATORY_CARE_PROVIDER_SITE_OTHER): Payer: BLUE CROSS/BLUE SHIELD | Admitting: Sports Medicine

## 2018-04-01 ENCOUNTER — Encounter: Payer: Self-pay | Admitting: Sports Medicine

## 2018-04-01 VITALS — BP 130/82 | HR 109 | Ht 66.5 in | Wt 193.6 lb

## 2018-04-01 DIAGNOSIS — G2589 Other specified extrapyramidal and movement disorders: Secondary | ICD-10-CM | POA: Diagnosis not present

## 2018-04-01 DIAGNOSIS — M25511 Pain in right shoulder: Secondary | ICD-10-CM

## 2018-04-01 DIAGNOSIS — S4991XA Unspecified injury of right shoulder and upper arm, initial encounter: Secondary | ICD-10-CM | POA: Diagnosis not present

## 2018-04-01 NOTE — Procedures (Signed)
PROCEDURE NOTE:  Ultrasound Guided: Injection: Right shoulder Images were obtained and interpreted by myself, Gaspar BiddingMichael Lashante Fryberger, DO  Images have been saved and stored to PACS system. Images obtained on: GE S7 Ultrasound machine    ULTRASOUND FINDINGS:  Biceps Tendon: Normal Pec Major Insertion: Normal Subscapularis Tendon: Slight thickening with possible interstitial tearing but no significant tendon retraction.  No impingement. Supraspinatus Tendon: Normal Infraspinatus/Teres Minor Tendon: Normal AC Joint: Small mushroom sign but no significant arthroscopic change. JOINT: No significant GH spurring appreciated  LABRUM: Incomplete evaluation of the labrum and intra-articular portion of the biceps tendon, if any lack of improvement further diagnostic evaluation with MR arthrogram could be considered.    DESCRIPTION OF PROCEDURE:  The patient's clinical condition is marked by substantial pain and/or significant functional disability. Other conservative therapy has not provided relief, is contraindicated, or not appropriate. There is a reasonable likelihood that injection will significantly improve the patient's pain and/or functional impairment.   After discussing the risks, benefits and expected outcomes of the injection and all questions were reviewed and answered, the patient wished to undergo the above named procedure.  Verbal consent was obtained.  The ultrasound was used to identify the target structure and adjacent neurovascular structures. The skin was then prepped in sterile fashion and the target structure was injected under direct visualization using sterile technique as below:  Single injection performed as below: PREP: Alcohol and Ethel Chloride APPROACH:posterior, single injection, 21g 2 in. INJECTATE: 2 cc 0.5% Marcaine and 2 cc 40mg /mL DepoMedrol ASPIRATE: None DRESSING: Band-Aid  Post procedural instructions including recommending icing and warning signs for infection were  reviewed.    This procedure was well tolerated and there were no complications.   IMPRESSION: Succesful Ultrasound Guided: Injection  Cannot fully evaluate the labrum and suspected SLAP lesion Is possible.

## 2018-04-01 NOTE — Progress Notes (Signed)
Garrett Hull. [facility] at [facility] [phone]  Garrett Hull - 37 y.o. male MRN [id number]  Date of birth: June 11, 1981  Visit Date: 04/01/2018  PCP: Garrett Massed, MD   Referred by: Garrett Mustard, MD  Scribe(s) for today's visit: Garrett Hull, CMA  SUBJECTIVE:  Garrett Hull is here for No chief complaint on file. Marland Kitchen  Referred by: Dr. Orland Hull  His R shoulder pain symptoms INITIALLY: Began about 1 month ago. Sx started after arm wrestling w/ hyperextension off the edge of car hood. He is R hand dominant.  Described as moderate (6/10) burning, non-radiating. Worsened with internal and external rotation, reaching behind his back. Improved with rest.  Additional associated symptoms include: He has noticed clicking and popping in the shoulder. He has noticed decreased ROM - he can move it but with a lot of pain. He denies swelling around the joint. Minimal pain and aBduction and aDduction.     At this time symptoms are worsening compared to onset, increased severity and frequency.  He has been taking Tylenol with minimal relief. He does not tolerate NSAIDs. He has tried taking Tramadol with minimal relief. He was prescribed topical Voltaren gel but it wasn't covered by his insurance.   XR R shoulder 03/12/2018.    REVIEW OF SYSTEMS: Reports night time disturbances. Denies fevers, chills, or night sweats. Denies unexplained weight loss. Denies personal history of cancer. Denies changes in bowel or bladder habits. Denies recent unreported falls. Denies new or worsening dyspnea or wheezing. Reports headaches - migraines.  Denies numbness, tingling or weakness  In the extremities.  Denies dizziness or presyncopal episodes Denies lower extremity edema    HISTORY & PERTINENT PRIOR DATA:  Prior History reviewed and updated per electronic medical record.  Significant/pertinent history, findings, studies include:  reports  that he has been smoking cigarettes.  He has a 8.00 pack-year smoking history. He has never used smokeless tobacco. No results for input(s): HGBA1C, LABURIC, CREATINE in the last 8760 hours. No specialty comments available. No problems updated.  OBJECTIVE:  VS:  HT:5' 6.5" (168.9 cm)   WT:193 lb 9.6 oz (87.8 kg)  BMI:30.78    BP:130/82  HR:(Abnormal) 109bpm  TEMP: ( )  RESP:96 %   PHYSICAL EXAM: Constitutional: WDWN, Non-toxic appearing. Psychiatric: Alert & appropriately interactive.  Not depressed or anxious appearing. Respiratory: No increased work of breathing.  Trachea Midline Eyes: Pupils are equal.  EOM intact without nystagmus.  No scleral icterus  Vascular Exam: warm to touch no edema  upper extremity neuro exam: unremarkable normal strength normal sensation  MSK Exam: Right shoulder is held in slight protraction with full overhead range of motion and range of motion. He has pain with resistance with internal rotation, external rotation, empty can testing, speeds testing O'Brien's testing and Yergason's testing localizing to the deep anterior shoulder.  He has pain with Hawkins but no mechanical clicking.  Small amount of pain with axial loading crepitation is present with this motion.  Negative brachial plexus squeeze overall good cervical range of motion.  Negative arm squeeze test.   ASSESSMENT & PLAN:   1. Acute pain of right shoulder   2. Injury of right shoulder, initial encounter   3. Scapular dyskinesis     PLAN: Ultrasound-guided intra-articular injection performed today for both diagnostic and therapeutic purposes.  Concern for potential underlying SLAP lesion.  If any lack of improvement after 2 weeks further evaluation can  be pursued with MR arthrogram of the right shoulder.  We will plan to check in with him in 6 weeks to ensure clinical improvement and resolution and have him begin working on home therapeutic exercises per procedure note.  Discussed  the foundation of treatment for this condition is physical therapy and/or daily (5-6 days/week) therapeutic exercises, focusing on core strengthening, coordination, neuromuscular control/reeducation.  Therapeutic exercises prescribed per procedure note.  Follow-up: Return in about 6 weeks (around 05/13/2018).      Please see additional documentation for Objective, Assessment and Plan sections. Pertinent additional documentation may be included in corresponding procedure notes, imaging studies, problem based documentation and patient instructions. Please see these sections of the encounter for additional information regarding this visit.  CMA/ATC served as Neurosurgeonscribe during this visit. History, Physical, and Plan performed by medical provider. Documentation and orders reviewed and attested to.      Garrett MewsMichael D Rigby, DO    [facility]

## 2018-04-01 NOTE — Progress Notes (Signed)

## 2018-04-01 NOTE — Patient Instructions (Addendum)
Please perform the exercise program that we have prepared for you and gone over in detail on a daily basis.  In addition to the handout you were provided you can access your program through: [email or web address]   Your unique program code is: [email or web address]   You had an injection today.  Things to be aware of after injection are listed below: . You may experience no significant improvement or even a slight worsening in your symptoms during the first 24 to 48 hours.  After that we expect your symptoms to improve gradually over the next 2 weeks for the medicine to have its maximal effect.  You should continue to have improvement out to 6 weeks after your injection. . Dr. Berline Choughigby recommends icing the site of the injection for 20 minutes  1-2 times the day of your injection . You may shower but no swimming, tub bath or Jacuzzi for 24 hours. . If your bandage falls off this does not need to be replaced.  It is appropriate to remove the bandage after 4 hours. . You may resume light activities as tolerated unless otherwise directed per Dr. Berline Choughigby during your visit  POSSIBLE STEROID SIDE EFFECTS:  Side effects from injectable steroids tend to be less than when taken orally however you may experience some of the symptoms listed below.  If experienced these should only last for a short period of time. Change in menstrual flow  Edema (swelling)  Increased appetite Skin flushing (redness)  Skin rash/acne  Thrush (oral) Yeast vaginitis    Increased sweating  Depression Increased blood glucose levels Cramping and leg/calf  Euphoria (feeling happy)  POSSIBLE PROCEDURE SIDE EFFECTS: The side effects of the injection are usually fairly minimal however if you may experience some of the following side effects that are usually self-limited and will is off on their own.  If you are concerned please feel free to call the office with questions:  Increased numbness or tingling  Nausea or vomiting  Swelling or bruising at  the injection site   Please call our office if if you experience any of the following symptoms over the next week as these can be signs of infection:   Fever greater than 100.58F  Significant swelling at the injection site  Significant redness or drainage from the injection site  If after 2 weeks you are continuing to have worsening symptoms please call our office to discuss what the next appropriate actions should be including the potential for a return office visit or other diagnostic testing.

## 2018-04-02 ENCOUNTER — Other Ambulatory Visit: Payer: Self-pay

## 2018-04-02 ENCOUNTER — Telehealth: Payer: Self-pay | Admitting: Sports Medicine

## 2018-04-02 MED ORDER — TRAMADOL HCL 50 MG PO TABS: 50.0000 mg | ORAL_TABLET | Freq: Three times a day (TID) | ORAL | 0 refills | Status: DC | PRN

## 2018-04-02 NOTE — Telephone Encounter (Signed)
Per Dr. Berline Choughigby, Rx called in for Tramadol. Called pt and advised. He will continue to ice his shoulder over the weekend. If pain does not improve over the weekend he will plan to f/u early next week. I also advised him to keep AVS handy because it has a list of possible side effects and to contact the office if he develops any of the listed side effects.

## 2018-04-02 NOTE — Telephone Encounter (Signed)
Spoke with patient, he reports that arm pain is 10 x worse than it was yesterday. He has notices increased warmth in his arm. He denies erythema, swelling, drainage at the injection site, fever. Pt advised that pain can be worse during the first 24-48 hrs after injection but should improve over the next 2 weeks. Will check with Dr. Berline Choughigby to see is re-evaluation is recommended.

## 2018-04-02 NOTE — Telephone Encounter (Signed)
See note.   Copied from CRM [id number]. Topic: General - Other >> Apr 02, 2018  8:40 AM Tamela OddiHarris, Brenda J wrote: Reason for CRM: Patient called to speak to the nurse regarding the cortisone shot he received yesterday, 04/01/18.  He states that he cannot raise his right arm at all and it is very painful.  Please advise.  CB# [phone].

## 2018-05-10 ENCOUNTER — Ambulatory Visit: Payer: BLUE CROSS/BLUE SHIELD | Admitting: Sports Medicine

## 2018-05-10 DIAGNOSIS — Z0289 Encounter for other administrative examinations: Secondary | ICD-10-CM

## 2018-05-11 ENCOUNTER — Ambulatory Visit (INDEPENDENT_AMBULATORY_CARE_PROVIDER_SITE_OTHER): Payer: BLUE CROSS/BLUE SHIELD | Admitting: Sports Medicine

## 2018-05-11 ENCOUNTER — Encounter: Payer: Self-pay | Admitting: Sports Medicine

## 2018-05-11 VITALS — BP 120/82 | HR 77 | Ht 66.5 in | Wt 195.2 lb

## 2018-05-11 DIAGNOSIS — M25511 Pain in right shoulder: Secondary | ICD-10-CM | POA: Diagnosis not present

## 2018-05-11 DIAGNOSIS — S4991XA Unspecified injury of right shoulder and upper arm, initial encounter: Secondary | ICD-10-CM | POA: Diagnosis not present

## 2018-05-11 DIAGNOSIS — M9905 Segmental and somatic dysfunction of pelvic region: Secondary | ICD-10-CM

## 2018-05-11 DIAGNOSIS — M9902 Segmental and somatic dysfunction of thoracic region: Secondary | ICD-10-CM | POA: Diagnosis not present

## 2018-05-11 DIAGNOSIS — M9908 Segmental and somatic dysfunction of rib cage: Secondary | ICD-10-CM

## 2018-05-11 DIAGNOSIS — G2589 Other specified extrapyramidal and movement disorders: Secondary | ICD-10-CM | POA: Diagnosis not present

## 2018-05-11 DIAGNOSIS — M9903 Segmental and somatic dysfunction of lumbar region: Secondary | ICD-10-CM

## 2018-05-11 DIAGNOSIS — M9901 Segmental and somatic dysfunction of cervical region: Secondary | ICD-10-CM

## 2018-05-11 MED ORDER — CYCLOBENZAPRINE HCL 10 MG PO TABS: 10.0000 mg | ORAL_TABLET | Freq: Three times a day (TID) | ORAL | 1 refills | Status: DC | PRN

## 2018-05-11 NOTE — Patient Instructions (Signed)
Also check out [employer]"Foundation Training" which is a program developed by Dr. Myles LippsEric Goodman.   There are links to a couple of his YouTube Videos below and I would like to see performing one of his videos 5-6 days per week.    A good intro video is: "Independence from Pain 7-minute Video" - [email or web address]   Exercises that focus more on the neck are as below: Dr. Derrill KayGoodman with [employer] teaching neck and shoulder details Part 1 - https://[email or web address] Part 2 Dr. Derrill KayGoodman with [facility] quick routine to practice daily - https://[email or web address].[email or web address]  Do not try to attempt the entire video when first beginning.    Try breaking of each exercise that he goes into shorter segments.  Otherwise if they perform an exercise for 45 seconds, start with 15 seconds and rest and then resume when they begin the new activity.  If you work your way up to being able to do these videos without having to stop, I expect you will see significant improvements in your pain.  If you enjoy his videos and would like to find out more you can look on his website: [email or web address].  He has a workout streaming option as well as a DVD set available for purchase.  Amazon has the best price for his DVDs.     Will need to get L clavicle x-ray at your next visit

## 2018-05-11 NOTE — Progress Notes (Signed)
Garrett Hull D. Delorise Shinerigby, DO  [place] [facility] [phone]  Garrett AmassDavid E Hull - 37 y.o. male MRN [id number]  Date of birth: 08/23/1981  Visit Date: 05/11/2018  PCP: Jeoffrey MassedMcGowen, Philip H, MD   Referred by: Jeoffrey MassedMcGowen, Philip H, MD  Scribe(s) for today's visit: Stevenson ClinchBrandy Coleman, CMA  SUBJECTIVE:  Garrett Amassavid E Kinn is here for neck and L shoulder pain   His neck/upper back & L shoulder symptoms INITIALLY: Began about 2 weeks ago and began after picking up a piano.  Described as stabbing pain that varies in intensity. Pain radiates into the L deltoid.  Worsened with lying down to sleep, turning head Improved with staying face forward.  Additional associated symptoms include: He reports popping in the neck, denies clicking or catching. He denies HA or visual change.     At this time symptoms show no change compared to onset. He has been taking Tylenol with minimal relief. He has tried using ice with minimal relief.    REVIEW OF SYSTEMS: Reports night time disturbances. Denies fevers, chills, or night sweats. Denies unexplained weight loss. Denies personal history of cancer. Denies changes in bowel or bladder habits. Denies recent unreported falls. Denies new or worsening dyspnea or wheezing. Denies headaches or dizziness.  Denies numbness, tingling or weakness  In the extremities.  Denies dizziness or presyncopal episodes Denies lower extremity edema     HISTORY & PERTINENT PRIOR DATA:  Significant/pertinent history, findings, studies include:  reports that he has been smoking cigarettes. He has a 8.00 pack-year smoking history. He has never used smokeless tobacco. No results for input(s): HGBA1C, LABURIC, CREATINE in the last 8760 hours. No specialty comments available. No problems updated.  Otherwise prior history reviewed and updated per electronic medical record.   OBJECTIVE:  VS:  HT:5' 6.5" (168.9 cm)   WT:195 lb 3.2 oz (88.5 kg)   BMI:31.04    BP:120/82  HR:77bpm  TEMP: ( )  RESP:97 %   PHYSICAL EXAM: CONSTITUTIONAL: Well-developed, Well-nourished and In no acute distress Alert & appropriately interactive. and Not depressed or anxious appearing. RESPIRATORY: No increased work of breathing and Trachea Midline EYES: Pupils are equal., EOM intact without nystagmus. and No scleral icterus.  Upper and Lower extremities: Warm and well perfused NEURO: unremarkable Normal associated myotomal distribution strength to manual muscle testing Normal sensation to light touch Normal and symmetric associated DTRs  MSK Exam: Back and neck: Tight hip flexors with negative straight leg raise although some pain localizing to the back without radicular symptoms with this.  He has negative Spurling's compression and Lhermitte's compression test.  No pain with popliteal compression test or palpation of the gluteal musculature.  Cervical range of motion is limited with sidebending and rotation.    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  PROCEDURES & DATA REVIEWED:  . Osteopathic manipulation was performed today based on physical exam findings.  Please see procedure note for further information including Osteopathic Exam findings  ASSESSMENT   1. Acute pain of right shoulder   2. Injury of right shoulder, initial encounter   3. Scapular dyskinesis   4. Somatic dysfunction of thoracic region   5. Somatic dysfunction of cervical region   6. Somatic dysfunction of lumbar region   7. Somatic dysfunction of rib cage region   8. Somatic dysfunction of pelvis region     PLAN:   Rest the injured area as much as practical prescription for muscle relaxant prescription for  NSAID given    . Please see procedure section and notes. . Links to [employer] provided today per Patient Instructions.  These exercises were developed by Myles Lipps, DC with a strong emphasis on core neuromuscular reducation  and postural realignment through body-weight exercises. . If any lack of improvement consider further diagnostic evaluation with X-ray left clavicle and consider x-rays of the lumbar and cervical spine. . Axial low back and neck pain improved with osteopathic manipulation today.  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  We will plan to obtain x-rays at follow-up. . 2 weeks for reevaluation and consideration of increasing therapeutic exercises and repeating osteopathic manipulation if indicated.  No problem-specific Assessment & Plan notes found for this encounter.  Follow-up: Return in about 2 weeks (around 05/25/2018) for for LEFT clavicle X-rays and consideration of repeat Osteopathic Manipulation.      Please see additional documentation for Objective, Assessment and Plan sections. Pertinent additional documentation may be included in corresponding procedure notes, imaging studies, problem based documentation and patient instructions. Please see these sections of the encounter for additional information regarding this visit.  CMA/ATC served as Neurosurgeon during this visit. History, Physical, and Plan performed by medical provider. Documentation and orders reviewed and attested to.      Andrena Mews, DO    [place] [facility]

## 2018-05-13 ENCOUNTER — Ambulatory Visit: Payer: BLUE CROSS/BLUE SHIELD | Admitting: Sports Medicine

## 2018-05-14 ENCOUNTER — Encounter: Payer: Self-pay | Admitting: Sports Medicine

## 2018-05-14 NOTE — Progress Notes (Signed)
PROCEDURE NOTE : OSTEOPATHIC MANIPULATION The decision today to treat with Osteopathic Manipulative Therapy (OMT) was based on physical exam findings. Verbal consent was obtained following a discussion with the patient regarding the of risks, benefits and potential side effects, including an acute pain flare,post manipulation soreness and need for repeat treatments.     Contraindications to OMT: NONE  Manipulation was performed as below: Regions treated: Cervical spine, Thoracic spine, Ribs, Lumbar spine and Pelvis OMT Techniques Used: HVLA, muscle energy, myofascial release, articulatory and facilitated positional release  The patient tolerated the treatment well and reported Improved symptoms following treatment today. Patient was given medications, exercises, stretches and lifestyle modifications per AVS and verbally.   OSTEOPATHIC/STRUCTURAL EXAM:   OA - rotated right C5 -7 Neutral, Rotated LEFT, Sidebent RIGHT T3 -6 Neutral, Rotated LEFT, Sidebent RIGHT T10 FRS left (Flexed, Rotated & Sidebent) Rib 8 Right  Posterior L4 FRS right (Flexed, Rotated & Sidebent) Right psoas spasm Right anterior innonimate

## 2018-05-28 ENCOUNTER — Ambulatory Visit: Payer: BLUE CROSS/BLUE SHIELD | Admitting: Sports Medicine

## 2018-05-28 DIAGNOSIS — Z0289 Encounter for other administrative examinations: Secondary | ICD-10-CM

## 2018-06-04 ENCOUNTER — Encounter: Payer: Self-pay | Admitting: Sports Medicine

## 2018-08-15 DIAGNOSIS — J189 Pneumonia, unspecified organism: Secondary | ICD-10-CM

## 2018-08-15 HISTORY — DX: Pneumonia, unspecified organism: J18.9

## 2018-08-25 ENCOUNTER — Encounter: Payer: Self-pay | Admitting: Physician Assistant

## 2018-08-25 ENCOUNTER — Telehealth: Payer: Self-pay | Admitting: Family Medicine

## 2018-08-25 ENCOUNTER — Ambulatory Visit (INDEPENDENT_AMBULATORY_CARE_PROVIDER_SITE_OTHER): Payer: BLUE CROSS/BLUE SHIELD | Admitting: Physician Assistant

## 2018-08-25 VITALS — BP 118/76 | HR 93 | Temp 98.4°F | Ht 66.5 in | Wt 205.0 lb

## 2018-08-25 DIAGNOSIS — G43809 Other migraine, not intractable, without status migrainosus: Secondary | ICD-10-CM | POA: Diagnosis not present

## 2018-08-25 DIAGNOSIS — R05 Cough: Secondary | ICD-10-CM | POA: Diagnosis not present

## 2018-08-25 DIAGNOSIS — R059 Cough, unspecified: Secondary | ICD-10-CM

## 2018-08-25 DIAGNOSIS — Z720 Tobacco use: Secondary | ICD-10-CM | POA: Insufficient documentation

## 2018-08-25 MED ORDER — METHYLPREDNISOLONE ACETATE 80 MG/ML IJ SUSP
80.0000 mg | Freq: Once | INTRAMUSCULAR | Status: AC
  Administered 2018-08-25: 80 mg via INTRAMUSCULAR

## 2018-08-25 MED ORDER — AZITHROMYCIN 250 MG PO TABS: ORAL_TABLET | ORAL | 0 refills | Status: DC

## 2018-08-25 MED ORDER — RIZATRIPTAN BENZOATE 10 MG PO TABS: 10.0000 mg | ORAL_TABLET | ORAL | 0 refills | Status: DC | PRN

## 2018-08-25 NOTE — Telephone Encounter (Signed)
OK with me.

## 2018-08-25 NOTE — Telephone Encounter (Signed)
-----   Message from Frann RiderMadison W Smith sent at 08/25/2018  4:33 PM EST ----- Regarding: TOC Patient would like to transfer care due to location. Please advise on approval or denial of transfer.
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
## 2018-08-25 NOTE — Patient Instructions (Addendum)
It was great to see you!  1. Start azithromycin antibiotic today 2. Try some delsym (dextromethorphan) for your cough -- you can buy this over the counter 3. Try to cut back on smoking :)  I have also refilled your Maxalt for migraines. Please follow-up with PCP for further refills.  Push fluids and get plenty of rest. Please return if you are not improving as expected, or if you have high fevers (>101.5) or difficulty swallowing or worsening productive cough.  Call clinic with questions.  I hope you start feeling better soon!

## 2018-08-30 ENCOUNTER — Ambulatory Visit (INDEPENDENT_AMBULATORY_CARE_PROVIDER_SITE_OTHER): Payer: BLUE CROSS/BLUE SHIELD | Admitting: Physician Assistant

## 2018-08-30 ENCOUNTER — Ambulatory Visit (INDEPENDENT_AMBULATORY_CARE_PROVIDER_SITE_OTHER): Payer: BLUE CROSS/BLUE SHIELD

## 2018-08-30 ENCOUNTER — Encounter: Payer: Self-pay | Admitting: Physician Assistant

## 2018-08-30 VITALS — BP 110/76 | HR 120 | Temp 98.5°F | Ht 66.5 in | Wt 204.0 lb

## 2018-08-30 DIAGNOSIS — J181 Lobar pneumonia, unspecified organism: Secondary | ICD-10-CM | POA: Diagnosis not present

## 2018-08-30 DIAGNOSIS — Z720 Tobacco use: Secondary | ICD-10-CM | POA: Diagnosis not present

## 2018-08-30 DIAGNOSIS — R05 Cough: Secondary | ICD-10-CM

## 2018-08-30 DIAGNOSIS — J984 Other disorders of lung: Secondary | ICD-10-CM | POA: Diagnosis not present

## 2018-08-30 DIAGNOSIS — R059 Cough, unspecified: Secondary | ICD-10-CM

## 2018-08-30 DIAGNOSIS — J189 Pneumonia, unspecified organism: Secondary | ICD-10-CM

## 2018-08-30 LAB — POC INFLUENZA A&B (BINAX/QUICKVUE)
Influenza A, POC: NEGATIVE
Influenza B, POC: NEGATIVE

## 2018-08-30 MED ORDER — LEVALBUTEROL HCL 1.25 MG/3ML IN NEBU
1.2500 mg | INHALATION_SOLUTION | Freq: Once | RESPIRATORY_TRACT | Status: AC
  Administered 2018-08-30: 1.25 mg via RESPIRATORY_TRACT

## 2018-08-30 MED ORDER — PREDNISONE 5 MG PO TABS: ORAL_TABLET | ORAL | 0 refills | Status: DC

## 2018-08-30 MED ORDER — LEVOFLOXACIN 500 MG PO TABS: 500.0000 mg | ORAL_TABLET | Freq: Every day | ORAL | 0 refills | Status: DC
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
## 2018-08-30 NOTE — Patient Instructions (Signed)
It was great to see you!  Use medication as prescribed: oral prednisone and levaquin oral antibiotic  Stop smoking or at least cut back  Push fluids and get plenty of rest. Please return if you are not improving as expected, or if you have high fevers (>101.5) or difficulty swallowing or worsening productive cough.  Call clinic with questions.  I hope you start feeling better soon!

## 2018-09-01 ENCOUNTER — Encounter: Payer: Self-pay | Admitting: Family Medicine

## 2018-10-25 ENCOUNTER — Ambulatory Visit: Payer: Self-pay | Admitting: *Deleted

## 2018-10-25 NOTE — Telephone Encounter (Signed)
Pt calling in with complaints abdominal pain and bloody stools for the past week. Pt states that the bloody stools became worse over the weekend. Pt states that he sees the blood wit wiping but does not see any blood in the toilet.  Pt denies a history of hemorrhoids, constipation or diarrhea but states he was diagnosed previously with ulcers. Pt states he "may have had a little today, more when I wipe but sometimes I feel like I have to wipe again after having a stool". Pt currently rates stomach pain at 3-4 and it occurs in the middle, lower part of his stomach and on the side. Pt states he feels bloated. No rectal pain but states his rectum is irritated from wiping. Pt states he is having the pain in the lower stomach between the middle and the side. Pt offered to make appt but t states she works in [employer] from 8-5 pm and is unable to take off work to come in for appt. Pt asking for latest appt on Wed, but unable to schedule at this time due to appt being a same day appt. Appt with PCP not available. Pt advised to return call to office on tomorrow to see if late appt was available.Pt also advised to seek treatment in the ED if current symptoms become worse. Pt verbalized understanding.   Reason for Disposition . MILD rectal bleeding (more than just a few drops or streaks)  Answer Assessment - Initial Assessment Questions 1. APPEARANCE of BLOOD: "What color is it?" "Is it passed separately, on the surface of the stool, or mixed in with the stool?"      Just with wiping, none seen in the toilet 2. AMOUNT: "How much blood was passed?"      Pt states he only notes blood in the stool with wiping 3. FREQUENCY: "How many times has blood been passed with the stools?"      Exact number of stools passed not given 4. ONSET: "When was the blood first seen in the stools?" (Days or weeks)      A week ago 5. DIARRHEA: "Is there also some diarrhea?" If so, ask: "How many diarrhea stools were passed in past  24 hours?"      No 6. CONSTIPATION: "Do you have constipation?" If so, "How bad is it?"     No 7. RECURRENT SYMPTOMS: "Have you had blood in your stools before?" If so, ask: "When was the last time?" and "What happened that time?"      Occurred since lat week but got worse over the weekend 8. BLOOD THINNERS: "Do you take any blood thinners?" (e.g., Coumadin/warfarin, Pradaxa/dabigatran, aspirin)     No 9. OTHER SYMPTOMS: "Do you have any other symptoms?"  (e.g., abdominal pain, vomiting, dizziness, fever)     Abdominal pain  Answer Assessment - Initial Assessment Questions 1. LOCATION: "Where does it hurt?"      Lower part of the stomach over to the side 2. RADIATION: "Does the pain shoot anywhere else?" (e.g., chest, back)     Has had chest pain maybe a month but they just come and go 3. ONSET: "When did the pain begin?" (Minutes, hours or days ago)      Last week 4. SUDDEN: "Gradual or sudden onset?"     gradual 5. PATTERN "Does the pain come and go, or is it constant?"    - If constant: "Is it getting better, staying the same, or worsening?"      (  Note: Constant means the pain never goes away completely; most serious pain is constant and it progresses)     - If intermittent: "How long does it last?" "Do you have pain now?"     (Note: Intermittent means the pain goes away completely between bouts)     Comes and goes 6. SEVERITY: "How bad is the pain?"  (e.g., Scale 1-10; mild, moderate, or severe)    - MILD (1-3): doesn't interfere with normal activities, abdomen soft and not tender to touch     - MODERATE (4-7): interferes with normal activities or awakens from sleep, tender to touch     - SEVERE (8-10): excruciating pain, doubled over, unable to do any normal activities       3-4 7. RECURRENT SYMPTOM: "Have you ever had this type of abdominal pain before?" If so, ask: "When was the last time?" and "What happened that time?"      Pt states he has been experiencing stomach pain over  the past week 8. CAUSE: "What do you think is causing the abdominal pain?"     unknown 9. RELIEVING/AGGRAVATING FACTORS: "What makes it better or worse?" (e.g., movement, antacids, bowel movement)     Pt states she has been taking tylenol with no improvement 10. OTHER SYMPTOMS: "Has there been any vomiting, diarrhea, constipation, or urine problems?"       No  Protocols used: RECTAL BLEEDING-A-AH, ABDOMINAL PAIN - MALE-A-AH

## 2018-10-26 ENCOUNTER — Encounter: Payer: Self-pay | Admitting: Family Medicine

## 2018-10-26 ENCOUNTER — Ambulatory Visit (INDEPENDENT_AMBULATORY_CARE_PROVIDER_SITE_OTHER): Payer: BLUE CROSS/BLUE SHIELD | Admitting: Family Medicine

## 2018-10-26 VITALS — BP 118/76 | HR 78 | Temp 98.8°F | Ht 66.5 in | Wt 208.8 lb

## 2018-10-26 DIAGNOSIS — K921 Melena: Secondary | ICD-10-CM

## 2018-10-26 DIAGNOSIS — R109 Unspecified abdominal pain: Secondary | ICD-10-CM

## 2018-10-26 NOTE — Progress Notes (Addendum)
   Chief Complaint:  Garrett Hull is a 38 y.o. male who presents for same day appointment with a chief complaint of abdominal pain.   Assessment/Plan:  Abdominal pain Benign abdominal exam.  He did have a few episodes of bright red blood per rectum that seem to have resolved.  Otherwise no other red flags.  Will check CBC, C met, lipase, and TSH.  Given his previous history of GI ulcer, will place referral to GI for further evaluation.  His vital signs are stable and he has no signs of active bleed-do not think that he needs emergent or urgent evaluation.  Discussed reasons to return to care and seek emergent care.      Subjective:  HPI:  Abdominal Pain, acute problem Started about a week ago.  Located in bilateral lower abdomen.  He had bloody stools for a few days however has not had anything for the last couple of days.  He had a few chills.  No nausea or vomiting.  He has had a history of stomach ulcers in the past secondary to NSAID use.  Had an endoscopy done 2 years ago that confirmed this.  Denies using any NSAIDs recently.  No nausea or vomiting.  No diarrhea or constipation.  No melena.  No chest pain or shortness of breath.  He has some fatigue however notes that this is near his baseline.  Patient states that predominately noticed blood on the toilet paper when wiping and surrounding stool in the toilet.  No other obvious alleviating or aggravating factors.    ROS: Per HPI  PMH: He reports that he has been smoking cigarettes. He has a 8.00 pack-year smoking history. He has never used smokeless tobacco. He reports current alcohol use of about 14.0 standard drinks of alcohol per week. He reports that he does not use drugs.      Objective:  Physical Exam: BP 118/76 (BP Location: Left Arm, Patient Position: Sitting, Cuff Size: Normal)   Pulse 78   Temp 98.8 F (37.1 C) (Oral)   Ht 5' 6.5" (1.689 m)   Wt 208 lb 12 oz (94.7 kg)   SpO2 95%   BMI 33.19 kg/m   Wt Readings from  Last 3 Encounters:  10/26/18 208 lb 12 oz (94.7 kg)  08/30/18 204 lb (92.5 kg)  08/25/18 205 lb (93 kg)  Gen: NAD, resting comfortably CV: Regular rate and rhythm with no murmurs appreciated Pulm: Normal work of breathing, clear to auscultation bilaterally with no crackles, wheezes, or rhonchi GI: Normal bowel sounds present. Soft, Nontender, Nondistended.  No rebound or guarding.   GU: Deferred      Time Spent: I spent >15 minutes face-to-face with the patient, with more than half spent on counseling for management plan for his abdominal pain and hematochezia.   Algis Greenhouse. Jerline Pain, MD 10/26/2018 4:23 PM

## 2018-10-26 NOTE — Patient Instructions (Addendum)
It was very nice to see you today!  We will check blood work today.  I will also place a referral to GI.  You should be called in a few days have this appointment scheduled.  Please seek medical care if your symptoms return or worsen.  Take care, Dr Jimmey Ralph

## 2018-10-26 NOTE — Telephone Encounter (Signed)
See note

## 2018-10-27 ENCOUNTER — Encounter: Payer: Self-pay | Admitting: Gastroenterology

## 2018-10-27 ENCOUNTER — Telehealth: Payer: Self-pay

## 2018-10-27 LAB — LIPASE: Lipase: 45 U/L (ref 11.0–59.0)

## 2018-10-27 LAB — CBC
HCT: 46.8 % (ref 39.0–52.0)
Hemoglobin: 15.7 g/dL (ref 13.0–17.0)
MCHC: 33.5 g/dL (ref 30.0–36.0)
MCV: 92.1 fl (ref 78.0–100.0)
Platelets: 305 10*3/uL (ref 150.0–400.0)
RBC: 5.08 Mil/uL (ref 4.22–5.81)
RDW: 15 % (ref 11.5–15.5)
WBC: 10.8 10*3/uL — ABNORMAL HIGH (ref 4.0–10.5)

## 2018-10-27 LAB — COMPREHENSIVE METABOLIC PANEL
ALT: 27 U/L (ref 0–53)
AST: 16 U/L (ref 0–37)
Albumin: 4.3 g/dL (ref 3.5–5.2)
Alkaline Phosphatase: 67 U/L (ref 39–117)
BUN: 14 mg/dL (ref 6–23)
CO2: 26 mEq/L (ref 19–32)
Calcium: 9.3 mg/dL (ref 8.4–10.5)
Chloride: 106 mEq/L (ref 96–112)
Creatinine, Ser: 1 mg/dL (ref 0.40–1.50)
GFR: 84.14 mL/min (ref >60.00)
Glucose, Bld: 80 mg/dL (ref 70–99)
Potassium: 4.4 mEq/L (ref 3.5–5.1)
Sodium: 140 mEq/L (ref 135–145)
Total Bilirubin: 0.2 mg/dL (ref 0.2–1.2)
Total Protein: 6.7 g/dL (ref 6.0–8.3)

## 2018-10-27 LAB — TSH: TSH: 0.72 u[IU]/mL (ref 0.35–4.50)

## 2018-10-27 NOTE — Telephone Encounter (Signed)
Patient seen in office on 10/26/2018.

## 2018-10-27 NOTE — Telephone Encounter (Signed)
Notified patient we will give him a call once labs are ready,verbalizes understanding.

## 2018-10-27 NOTE — Telephone Encounter (Signed)
Copied from CRM [id number]. Topic: Quick Communication - Lab Results (Clinic Use ONLY) >> Oct 27, 2018 10:45 AM Crist Infante wrote: Pt calling back for lab results done yesterday. Pt still declined mychart set up.

## 2018-10-27 NOTE — Telephone Encounter (Signed)
Labs were drawn at the end of the day yesterday.  They are not back yet.

## 2018-10-28 NOTE — Progress Notes (Signed)
Please inform patient of the following:  His blood work is NORMAL. Would like for him to follow up with GI as we discussed. Do not need to do any further testing at this point.  Katina Degree. Jimmey Ralph, MD 10/28/2018 8:14 AM

## 2018-11-18 ENCOUNTER — Encounter: Payer: Self-pay | Admitting: Gastroenterology

## 2018-11-18 ENCOUNTER — Ambulatory Visit (INDEPENDENT_AMBULATORY_CARE_PROVIDER_SITE_OTHER): Payer: BLUE CROSS/BLUE SHIELD | Admitting: Gastroenterology

## 2018-11-18 VITALS — BP 118/82 | HR 92 | Ht 66.5 in | Wt 208.6 lb

## 2018-11-18 DIAGNOSIS — K625 Hemorrhage of anus and rectum: Secondary | ICD-10-CM

## 2018-11-18 DIAGNOSIS — R140 Abdominal distension (gaseous): Secondary | ICD-10-CM

## 2018-11-18 MED ORDER — PEG-KCL-NACL-NASULF-NA ASC-C 140 G PO SOLR: 140.0000 g | ORAL | 0 refills | Status: DC

## 2018-11-18 NOTE — Patient Instructions (Signed)
You have been scheduled for a colonoscopy. Please follow written instructions given to you at your visit today.  Please pick up your prep supplies at the pharmacy within the next 1-3 days. If you use inhalers (even only as needed), please bring them with you on the day of your procedure. Your physician has requested that you go to www.startemmi.com and enter the access code given to you at your visit today. This web site gives a general overview about your procedure. However, you should still follow specific instructions given to you by our office regarding your preparation for the procedure.  If you are age 70 or older, your body mass index should be between 23-30. Your Body mass index is 33.16 kg/m. If this is out of the aforementioned range listed, please consider follow up with your Primary Care Provider.  If you are age 22 or younger, your body mass index should be between 19-25. Your Body mass index is 33.16 kg/m. If this is out of the aformentioned range listed, please consider follow up with your Primary Care Provider.   It was a pleasure to see you today!  Dr. Myrtie Neither
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
## 2018-12-01 ENCOUNTER — Telehealth: Payer: Self-pay

## 2018-12-01 NOTE — Telephone Encounter (Signed)
Called Patient to go over screening questions and he wants to reschedule since he is currently out of work.

## 2018-12-02 ENCOUNTER — Encounter: Payer: BLUE CROSS/BLUE SHIELD | Admitting: Gastroenterology

## 2019-01-09 ENCOUNTER — Telehealth: Payer: Self-pay | Admitting: Gastroenterology

## 2019-01-09 NOTE — Telephone Encounter (Signed)
This patient canceled his 3/19 colonoscopy due to COVID.  Please contact him to reschedule it for sometime in May.

## 2019-01-17 ENCOUNTER — Other Ambulatory Visit: Payer: Self-pay

## 2019-01-17 DIAGNOSIS — K625 Hemorrhage of anus and rectum: Secondary | ICD-10-CM

## 2019-01-17 DIAGNOSIS — R140 Abdominal distension (gaseous): Secondary | ICD-10-CM

## 2019-01-17 MED ORDER — PEG-KCL-NACL-NASULF-NA ASC-C 140 G PO SOLR: 140.0000 g | ORAL | 0 refills | Status: DC

## 2019-01-17 NOTE — Telephone Encounter (Signed)
Scheduled for 02-03-2019 @1pm . Paper work and instructions mailed to his address.

## 2019-02-01 ENCOUNTER — Telehealth: Payer: Self-pay | Admitting: *Deleted

## 2019-02-01 NOTE — Telephone Encounter (Signed)
Called pt to complete covid screening questions prior to procedure.  Pt states that he wishes to cancel d/t his child being sick.  He will call back to reschedule for next month when he gets his work schedule.

## 2019-02-03 ENCOUNTER — Encounter: Payer: BLUE CROSS/BLUE SHIELD | Admitting: Gastroenterology

## 2019-05-03 ENCOUNTER — Other Ambulatory Visit: Payer: Self-pay

## 2019-05-03 ENCOUNTER — Ambulatory Visit (INDEPENDENT_AMBULATORY_CARE_PROVIDER_SITE_OTHER): Payer: BC Managed Care – PPO

## 2019-05-03 ENCOUNTER — Telehealth: Payer: Self-pay | Admitting: Physician Assistant

## 2019-05-03 ENCOUNTER — Encounter: Payer: Self-pay | Admitting: Physician Assistant

## 2019-05-03 ENCOUNTER — Ambulatory Visit (INDEPENDENT_AMBULATORY_CARE_PROVIDER_SITE_OTHER): Payer: BC Managed Care – PPO | Admitting: Physician Assistant

## 2019-05-03 VITALS — BP 120/80 | HR 83 | Temp 98.7°F | Ht 66.5 in | Wt 207.0 lb

## 2019-05-03 DIAGNOSIS — M25441 Effusion, right hand: Secondary | ICD-10-CM

## 2019-05-03 DIAGNOSIS — M7989 Other specified soft tissue disorders: Secondary | ICD-10-CM | POA: Diagnosis not present

## 2019-05-03 DIAGNOSIS — M79642 Pain in left hand: Secondary | ICD-10-CM

## 2019-05-03 NOTE — Telephone Encounter (Signed)
Spoke to pt asked him did you not understand what I told you earlier? Pt said I guess not is it broke or not. Told pt your finger is not broke the xray showed a foreign body in your finger. We do not know what it is that is why it needs to be evaluated. Pt verbalized understanding.

## 2019-05-03 NOTE — Patient Instructions (Signed)
It was great to see you!  1. For your left hand, try to wrap your hand with the ACE bandage to see if that helps. After golfing, you could submerge your hand in cool water or ice to see if that helps. If it gets worse, or doesn't improve, let me know.  2. For your finger on your R hand, let's get an xray and go from there. I will be in touch as soon as the radiologist reads it.  Take care,  Inda Coke PA-C

## 2019-05-03 NOTE — Telephone Encounter (Signed)
Called pt in regards to his referral, changing locations - he would also like Butch Penny or Aldona Bar to call him back to explain what is wrong with his finger / what the xray showed.
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
## 2019-05-16 DIAGNOSIS — M778 Other enthesopathies, not elsewhere classified: Secondary | ICD-10-CM | POA: Insufficient documentation

## 2019-05-16 DIAGNOSIS — M25532 Pain in left wrist: Secondary | ICD-10-CM | POA: Diagnosis not present

## 2019-05-16 DIAGNOSIS — M79644 Pain in right finger(s): Secondary | ICD-10-CM | POA: Diagnosis not present

## 2019-05-16 DIAGNOSIS — S63632A Sprain of interphalangeal joint of right middle finger, initial encounter: Secondary | ICD-10-CM | POA: Diagnosis not present

## 2019-09-21 NOTE — Telephone Encounter (Signed)
Patient seen last year for rectal bleeding.  Canceled colonoscopy due to COVID.  Please contact him and see if he is interested in scheduling it.  If not, please ask him to contact us when he feels ready.

## 2019-09-22 NOTE — Telephone Encounter (Signed)
Spoke to patient who wishes to not proceed at this time. He declines any GI problems or complaints and does no want to reschedule. Pt advised to call back if symptoms return.

## 2019-11-17 ENCOUNTER — Ambulatory Visit (INDEPENDENT_AMBULATORY_CARE_PROVIDER_SITE_OTHER): Payer: Commercial Managed Care - HMO | Admitting: Family Medicine

## 2019-11-17 ENCOUNTER — Encounter: Payer: Self-pay | Admitting: Family Medicine

## 2019-11-17 ENCOUNTER — Other Ambulatory Visit: Payer: Self-pay

## 2019-11-17 VITALS — Temp 98.5°F | Ht 66.5 in

## 2019-11-17 DIAGNOSIS — J209 Acute bronchitis, unspecified: Secondary | ICD-10-CM

## 2019-11-17 DIAGNOSIS — J069 Acute upper respiratory infection, unspecified: Secondary | ICD-10-CM | POA: Diagnosis not present

## 2019-11-17 MED ORDER — HYDROCODONE-HOMATROPINE 5-1.5 MG/5ML PO SYRP: ORAL_SOLUTION | ORAL | 0 refills | Status: DC
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
## 2019-11-18 ENCOUNTER — Ambulatory Visit: Payer: Commercial Managed Care - HMO | Admitting: Physician Assistant

## 2019-11-21 ENCOUNTER — Encounter: Payer: Self-pay | Admitting: Physician Assistant

## 2019-11-21 ENCOUNTER — Other Ambulatory Visit: Payer: Self-pay

## 2019-11-21 ENCOUNTER — Ambulatory Visit (INDEPENDENT_AMBULATORY_CARE_PROVIDER_SITE_OTHER): Payer: Commercial Managed Care - HMO | Admitting: Physician Assistant

## 2019-11-21 ENCOUNTER — Ambulatory Visit (INDEPENDENT_AMBULATORY_CARE_PROVIDER_SITE_OTHER): Payer: Commercial Managed Care - HMO

## 2019-11-21 VITALS — BP 120/70 | HR 78 | Temp 98.5°F | Ht 66.5 in | Wt 211.2 lb

## 2019-11-21 DIAGNOSIS — J069 Acute upper respiratory infection, unspecified: Secondary | ICD-10-CM

## 2019-11-21 MED ORDER — AZITHROMYCIN 250 MG PO TABS: ORAL_TABLET | ORAL | 0 refills | Status: DC

## 2019-11-21 MED ORDER — CYCLOBENZAPRINE HCL 10 MG PO TABS: 10.0000 mg | ORAL_TABLET | Freq: Every evening | ORAL | 0 refills | Status: DC | PRN

## 2019-11-21 MED ORDER — PREDNISONE 20 MG PO TABS: 40.0000 mg | ORAL_TABLET | Freq: Every day | ORAL | 0 refills | Status: DC

## 2019-11-21 MED ORDER — METHYLPREDNISOLONE ACETATE 80 MG/ML IJ SUSP
80.0000 mg | Freq: Once | INTRAMUSCULAR | Status: AC
  Administered 2019-11-21: 80 mg via INTRAMUSCULAR
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
## 2019-11-21 NOTE — Patient Instructions (Signed)
It was great to see you!  For your cough:  --Start oral antibiotic today --May use flexeril (muscle relaxer) at night -- may make you sleepy --If you feel like trialing more prednisone to help with cough and pain you can start tomorrow --I recommend starting delsym (or store brand equivalent) cough syrup for your cough --I will be in touch with your xray results  Follow-up if worsening symptoms. If you develop shortness of breath, chest pain, or other worrisome symptoms please go to the ER.    Take care,  Jarold Motto PA-C

## 2019-11-22 ENCOUNTER — Telehealth: Payer: Self-pay

## 2019-11-22 NOTE — Telephone Encounter (Signed)
See result notes. 

## 2019-11-22 NOTE — Telephone Encounter (Signed)
Patient returning missed call. 

## 2019-11-23 ENCOUNTER — Encounter (HOSPITAL_BASED_OUTPATIENT_CLINIC_OR_DEPARTMENT_OTHER): Payer: Self-pay

## 2019-11-23 ENCOUNTER — Other Ambulatory Visit: Payer: Self-pay

## 2019-11-23 ENCOUNTER — Emergency Department (HOSPITAL_BASED_OUTPATIENT_CLINIC_OR_DEPARTMENT_OTHER): Payer: Self-pay

## 2019-11-23 ENCOUNTER — Telehealth: Payer: Self-pay | Admitting: Physician Assistant

## 2019-11-23 ENCOUNTER — Emergency Department (HOSPITAL_BASED_OUTPATIENT_CLINIC_OR_DEPARTMENT_OTHER)
Admission: EM | Admit: 2019-11-23 | Discharge: 2019-11-23 | Disposition: A | Discharge status: Home / Self Care | Payer: Self-pay | Attending: Emergency Medicine | Admitting: Emergency Medicine

## 2019-11-23 DIAGNOSIS — R0781 Pleurodynia: Secondary | ICD-10-CM

## 2019-11-23 DIAGNOSIS — R072 Precordial pain: Secondary | ICD-10-CM | POA: Insufficient documentation

## 2019-11-23 DIAGNOSIS — Z888 Allergy status to other drugs, medicaments and biological substances status: Secondary | ICD-10-CM | POA: Insufficient documentation

## 2019-11-23 DIAGNOSIS — F17210 Nicotine dependence, cigarettes, uncomplicated: Secondary | ICD-10-CM | POA: Insufficient documentation

## 2019-11-23 DIAGNOSIS — R05 Cough: Secondary | ICD-10-CM | POA: Insufficient documentation

## 2019-11-23 DIAGNOSIS — D72829 Elevated white blood cell count, unspecified: Secondary | ICD-10-CM | POA: Insufficient documentation

## 2019-11-23 DIAGNOSIS — E785 Hyperlipidemia, unspecified: Secondary | ICD-10-CM | POA: Insufficient documentation

## 2019-11-23 DIAGNOSIS — R059 Cough, unspecified: Secondary | ICD-10-CM

## 2019-11-23 DIAGNOSIS — J189 Pneumonia, unspecified organism: Secondary | ICD-10-CM

## 2019-11-23 LAB — TROPONIN I (HIGH SENSITIVITY)
Troponin I (High Sensitivity): <2 ng/L (ref <18)
Troponin I (High Sensitivity): <2 ng/L (ref <18)

## 2019-11-23 LAB — BASIC METABOLIC PANEL
Anion gap: 11 (ref 5–15)
BUN: 13 mg/dL (ref 6–20)
CO2: 23 mmol/L (ref 22–32)
Calcium: 9 mg/dL (ref 8.9–10.3)
Chloride: 104 mmol/L (ref 98–111)
Creatinine, Ser: 1.09 mg/dL (ref 0.61–1.24)
GFR calc Af Amer: >60 mL/min (ref >60)
GFR calc non Af Amer: >60 mL/min (ref >60)
Glucose, Bld: 122 mg/dL — ABNORMAL HIGH (ref 70–99)
Potassium: 4.1 mmol/L (ref 3.5–5.1)
Sodium: 138 mmol/L (ref 135–145)

## 2019-11-23 LAB — CBC WITH DIFFERENTIAL/PLATELET
Abs Immature Granulocytes: 0.28 10*3/uL — ABNORMAL HIGH (ref 0.00–0.07)
Basophils Absolute: 0.1 10*3/uL (ref 0.0–0.1)
Basophils Relative: 0 %
Eosinophils Absolute: 0 10*3/uL (ref 0.0–0.5)
Eosinophils Relative: 0 %
HCT: 46 % (ref 39.0–52.0)
Hemoglobin: 15.3 g/dL (ref 13.0–17.0)
Immature Granulocytes: 1 %
Lymphocytes Relative: 10 %
Lymphs Abs: 2.3 10*3/uL (ref 0.7–4.0)
MCH: 30.7 pg (ref 26.0–34.0)
MCHC: 33.3 g/dL (ref 30.0–36.0)
MCV: 92.4 fL (ref 80.0–100.0)
Monocytes Absolute: 0.8 10*3/uL (ref 0.1–1.0)
Monocytes Relative: 3 %
Neutro Abs: 20.1 10*3/uL — ABNORMAL HIGH (ref 1.7–7.7)
Neutrophils Relative %: 86 %
Platelets: 393 10*3/uL (ref 150–400)
RBC: 4.98 MIL/uL (ref 4.22–5.81)
RDW: 13.3 % (ref 11.5–15.5)
WBC: 23.5 10*3/uL — ABNORMAL HIGH (ref 4.0–10.5)
nRBC: 0 % (ref 0.0–0.2)

## 2019-11-23 MED ORDER — SODIUM CHLORIDE 0.9 % IV BOLUS
1000.0000 mL | Freq: Once | INTRAVENOUS | Status: AC
  Administered 2019-11-23: 17:00:00 1000 mL via INTRAVENOUS

## 2019-11-23 MED ORDER — LEVOFLOXACIN 750 MG PO TABS: 750.0000 mg | ORAL_TABLET | Freq: Every day | ORAL | 0 refills | Status: AC

## 2019-11-23 MED ORDER — IOHEXOL 350 MG/ML SOLN
100.0000 mL | Freq: Once | INTRAVENOUS | Status: AC
  Administered 2019-11-23: 100 mL via INTRAVENOUS

## 2019-11-23 MED ORDER — HYDROCODONE-ACETAMINOPHEN 5-325 MG PO TABS
1.0000 | ORAL_TABLET | Freq: Once | ORAL | Status: AC
  Administered 2019-11-23: 16:00:00 1 via ORAL
  Filled 2019-11-23: qty 1

## 2019-11-23 MED ORDER — KETOROLAC TROMETHAMINE 30 MG/ML IJ SOLN
30.0000 mg | Freq: Once | INTRAMUSCULAR | Status: AC
  Administered 2019-11-23: 17:00:00 30 mg via INTRAVENOUS
  Filled 2019-11-23: qty 1

## 2019-11-23 MED ORDER — BENZONATATE 100 MG PO CAPS: 100.0000 mg | ORAL_CAPSULE | Freq: Three times a day (TID) | ORAL | 0 refills | Status: DC

## 2019-11-23 NOTE — Discharge Instructions (Signed)
Please discontinue your Azithromycin and start taking Levaquin to cover for your pulmonary infection  I have prescribed medication to help with coughing as well You can continue taking the Prednisone  Follow up with your PCP in 1 week for recheck of symptoms  Return to the ED for any worsening symptoms including worsening cough, shortness of breath, worsening chest pain, fevers > 100.4

## 2019-11-23 NOTE — ED Provider Notes (Signed)
Care assumed from [facility], PA-C, at shift change, please see their notes for full documentation of patient's complaint/HPI. Briefly, pt here with right sided pleuritic chest pain x 1 week and coughing. Results so far show leukocytosis 23,500 however patient is currently on Prednisone. Awaiting troponin and CTA to rule out PE. Plan is to dispo accordingly if no PE. Possible change of antibiotics if any findings on CT scan for pneumonia or other lung infection and tessalon perles for cough.  Physical Exam  BP 116/78   Pulse 79   Temp 98.5 F (36.9 C) (Oral)   Resp 16   Ht 5\' 7"  (1.702 m)   Wt 93.4 kg   SpO2 96%   BMI 32.26 kg/m   Physical Exam  ED Course/Procedures   Clinical Course as of Nov 23 1939  Wed Nov 23, 2019  1724 Troponin I (High Sensitivity): <2 [MV]    Clinical Course User Index [MV] Nov 25, 2019, PA-C    Procedures Results for orders placed or performed during the hospital encounter of 11/23/19  CBC with Differential  Result Value Ref Range   WBC 23.5 (H) 4.0 - 10.5 K/uL   RBC 4.98 4.22 - 5.81 MIL/uL   Hemoglobin 15.3 13.0 - 17.0 g/dL   HCT 01/23/20 76.2 - 83.1 %   MCV 92.4 80.0 - 100.0 fL   MCH 30.7 26.0 - 34.0 pg   MCHC 33.3 30.0 - 36.0 g/dL   RDW 51.7 61.6 - 07.3 %   Platelets 393 150 - 400 K/uL   nRBC 0.0 0.0 - 0.2 %   Neutrophils Relative % 86 %   Neutro Abs 20.1 (H) 1.7 - 7.7 K/uL   Lymphocytes Relative 10 %   Lymphs Abs 2.3 0.7 - 4.0 K/uL   Monocytes Relative 3 %   Monocytes Absolute 0.8 0.1 - 1.0 K/uL   Eosinophils Relative 0 %   Eosinophils Absolute 0.0 0.0 - 0.5 K/uL   Basophils Relative 0 %   Basophils Absolute 0.1 0.0 - 0.1 K/uL   Immature Granulocytes 1 %   Abs Immature Granulocytes 0.28 (H) 0.00 - 0.07 K/uL  Basic metabolic panel  Result Value Ref Range   Sodium 138 135 - 145 mmol/L   Potassium 4.1 3.5 - 5.1 mmol/L   Chloride 104 98 - 111 mmol/L   CO2 23 22 - 32 mmol/L   Glucose, Bld 122 (H) 70 - 99 mg/dL   BUN 13 6 - 20  mg/dL   Creatinine, Ser 71.0 0.61 - 1.24 mg/dL   Calcium 9.0 8.9 - 6.26 mg/dL   GFR calc non Af Amer >60 >60 mL/min   GFR calc Af Amer >60 >60 mL/min   Anion gap 11 5 - 15  Troponin I (High Sensitivity)  Result Value Ref Range   Troponin I (High Sensitivity) <2 <18 ng/L  Troponin I (High Sensitivity)  Result Value Ref Range   Troponin I (High Sensitivity) <2 <18 ng/L    MDM  Troponin < 2. CTA without findings for PE. Does show possible aspiration pneumonitis and ground glass opacities. Recent negative covid test. Pt is non appearing and nonhypoxic. Do not feel he requires admission. Will discontinue azithromycin and change to Levaquin to cover for pneumonitis. Pt advised to continue prednisone until the course is finished. He is to follow up with his PCP for recheck in 1 week. Strict return precautions discussed with pt. He is in agreement with plan and stable for discharge home.   This note  was prepared using Systems analyst and may include unintentional dictation errors due to the inherent limitations of voice recognition software.        Eustaquio Maize, PA-C 11/23/19 1944    Veryl Speak, MD 11/23/19 2136
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
## 2019-11-23 NOTE — ED Triage Notes (Addendum)
Pt c/o pain to right rib area that started ~1 week ago after 2-3 days of coughing-was seen by PCP with neg covid test and CXR with "either bronchitis or pneumonia"-pt is taking abx-states pain worse with cough and movement-was advised by PCP to come to ED
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
## 2019-11-23 NOTE — ED Notes (Signed)
PT to CT scan

## 2019-11-24 ENCOUNTER — Telehealth (HOSPITAL_BASED_OUTPATIENT_CLINIC_OR_DEPARTMENT_OTHER): Payer: Self-pay | Admitting: Emergency Medicine

## 2019-11-24 NOTE — Telephone Encounter (Signed)
Pharmacy called asking for change in antibiotics due to levaquin and prednisone. Chart review indicates RLL PNA. Possible aspiration. Will treat with augmentin and doxy instead.

## 2019-12-06 ENCOUNTER — Other Ambulatory Visit: Payer: Self-pay

## 2020-04-27 ENCOUNTER — Encounter: Payer: Self-pay | Admitting: Family Medicine

## 2020-04-27 ENCOUNTER — Other Ambulatory Visit: Payer: Self-pay

## 2020-04-27 ENCOUNTER — Ambulatory Visit: Payer: BC Managed Care – PPO | Admitting: Family Medicine

## 2020-04-27 VITALS — BP 130/80 | HR 92 | Temp 98.4°F | Ht 67.0 in | Wt 219.0 lb

## 2020-04-27 DIAGNOSIS — L739 Follicular disorder, unspecified: Secondary | ICD-10-CM

## 2020-04-27 MED ORDER — DOXYCYCLINE HYCLATE 100 MG PO CAPS: 100.0000 mg | ORAL_CAPSULE | Freq: Two times a day (BID) | ORAL | 0 refills | Status: DC

## 2020-04-27 NOTE — Patient Instructions (Signed)
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
## 2020-07-09 DIAGNOSIS — Z20822 Contact with and (suspected) exposure to covid-19: Secondary | ICD-10-CM | POA: Diagnosis not present

## 2020-07-09 DIAGNOSIS — J069 Acute upper respiratory infection, unspecified: Secondary | ICD-10-CM | POA: Diagnosis not present

## 2020-07-11 DIAGNOSIS — R059 Cough, unspecified: Secondary | ICD-10-CM | POA: Diagnosis not present

## 2020-07-11 DIAGNOSIS — Z202 Contact with and (suspected) exposure to infections with a predominantly sexual mode of transmission: Secondary | ICD-10-CM | POA: Diagnosis not present

## 2020-07-11 DIAGNOSIS — Z20822 Contact with and (suspected) exposure to covid-19: Secondary | ICD-10-CM | POA: Diagnosis not present

## 2020-09-11 ENCOUNTER — Telehealth: Payer: Self-pay

## 2020-09-11 DIAGNOSIS — Z3009 Encounter for other general counseling and advice on contraception: Secondary | ICD-10-CM

## 2020-09-11 NOTE — Telephone Encounter (Signed)
Patient called in and would like a referral sent in for vasectomy.

## 2020-09-11 NOTE — Telephone Encounter (Signed)
Spoke to pt told him referral has been placed and someone will contact him to schedule an appointment. Pt verbalized understanding.

## 2021-05-29 ENCOUNTER — Other Ambulatory Visit: Payer: Self-pay

## 2021-05-29 ENCOUNTER — Encounter: Payer: Self-pay | Admitting: Family Medicine

## 2021-05-29 ENCOUNTER — Ambulatory Visit (INDEPENDENT_AMBULATORY_CARE_PROVIDER_SITE_OTHER): Payer: Managed Care, Other (non HMO) | Admitting: Family Medicine

## 2021-05-29 VITALS — BP 116/90 | HR 95 | Temp 98.4°F | Wt 222.4 lb

## 2021-05-29 DIAGNOSIS — M5441 Lumbago with sciatica, right side: Secondary | ICD-10-CM | POA: Diagnosis not present

## 2021-05-29 DIAGNOSIS — M5442 Lumbago with sciatica, left side: Secondary | ICD-10-CM

## 2021-05-29 DIAGNOSIS — R109 Unspecified abdominal pain: Secondary | ICD-10-CM

## 2021-05-29 MED ORDER — TAMSULOSIN HCL 0.4 MG PO CAPS: 0.4000 mg | ORAL_CAPSULE | Freq: Every day | ORAL | 0 refills | Status: DC
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
## 2021-07-28 IMAGING — DX RIGHT MIDDLE FINGER 2+V
3 series · 3 of 3 positions shown · non-contrast
Comparison: None.

CLINICAL DATA: Third digit swelling

EXAM:
RIGHT MIDDLE FINGER 2+V

[finger pa]
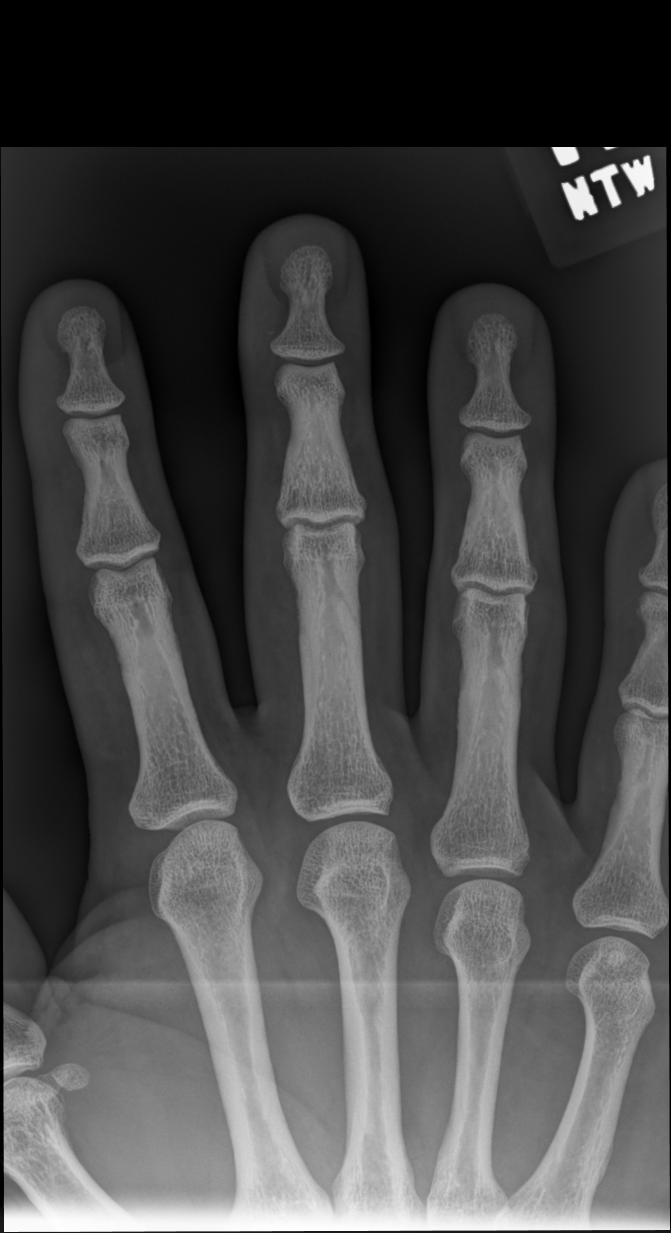

[finger oblique]
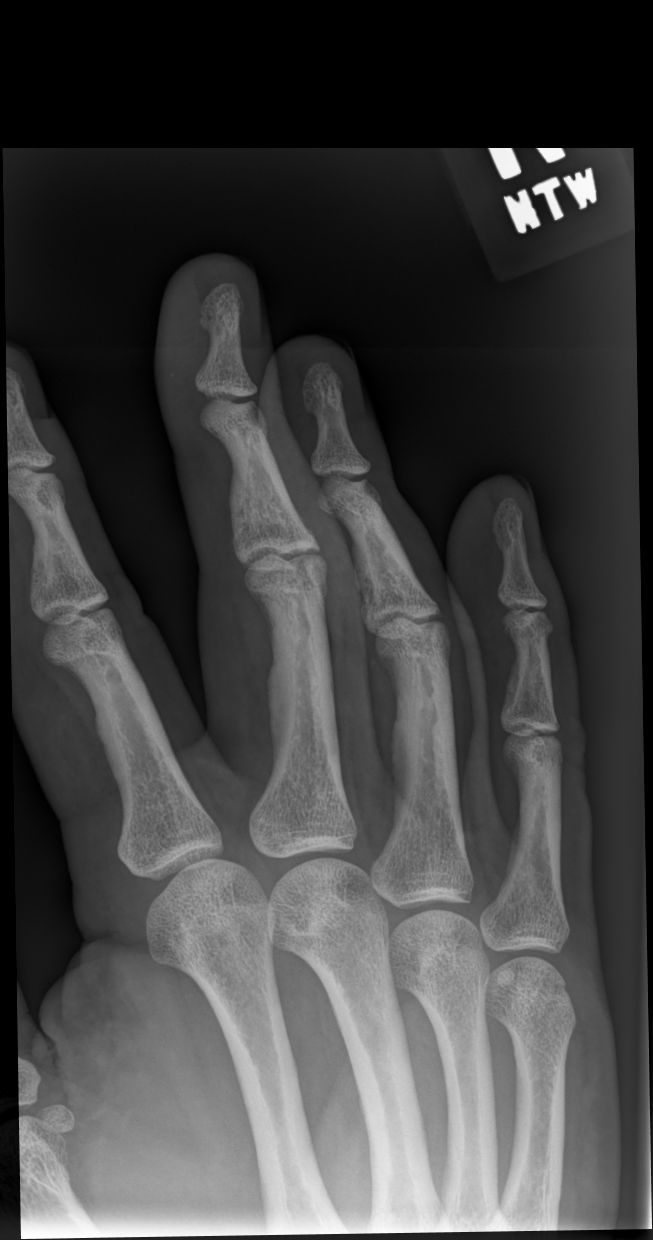

[finger lat]
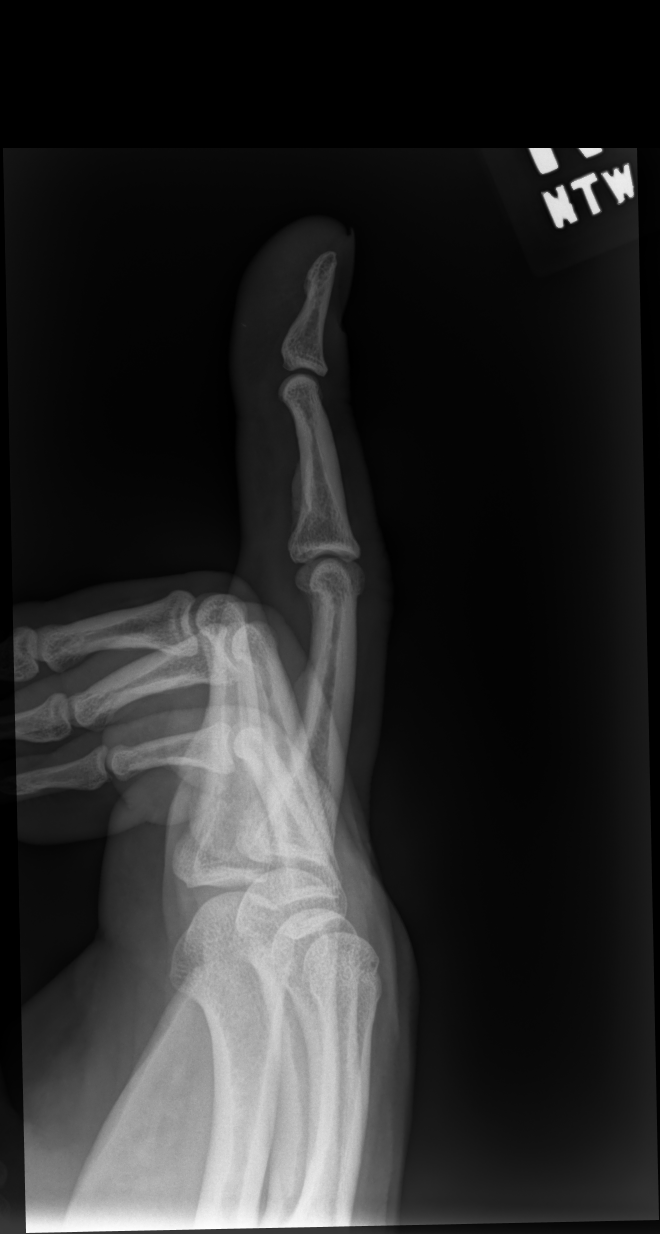

[3 of 3 positions shown; findings below may reference images not displayed]

FINDINGS: No acute fracture or dislocation is noted. A tiny density is seen in
the soft tissues of the distal third digit just below the skin
surface anteriorly. No other focal abnormality is noted.
IMPRESSION: Tiny radiopaque foreign body which is likely the etiology of the
swelling. No acute bony abnormality is noted.

## 2021-07-30 ENCOUNTER — Ambulatory Visit (INDEPENDENT_AMBULATORY_CARE_PROVIDER_SITE_OTHER): Payer: 59 | Admitting: Family Medicine

## 2021-07-30 ENCOUNTER — Encounter: Payer: Self-pay | Admitting: Family Medicine

## 2021-07-30 VITALS — BP 103/66 | HR 103 | Temp 98.0°F | Ht 67.0 in | Wt 229.0 lb

## 2021-07-30 DIAGNOSIS — J111 Influenza due to unidentified influenza virus with other respiratory manifestations: Secondary | ICD-10-CM

## 2021-07-30 MED ORDER — OSELTAMIVIR PHOSPHATE 75 MG PO CAPS: 75.0000 mg | ORAL_CAPSULE | Freq: Two times a day (BID) | ORAL | 0 refills | Status: DC

## 2021-07-30 NOTE — Patient Instructions (Signed)
It was very nice to see you today!  I think you have the flu.  It is ok for you to return to work if you have no fever for 24 hours and your other symptoms are improving.  We will start tamiflu.  Let us know if your symptoms are not improving.   Take care, Dr Jimmey Ralph  PLEASE NOTE:  If you had any lab tests please let us know if you have not heard back within a few days. You may see your results on mychart before we have a chance to review them but we will give you a call once they are reviewed by [place]. If we ordered any referrals today, please let us know if you have not heard from their office within the next week.   Please try these tips to maintain a healthy lifestyle:  Eat at least 3 REAL meals and 1-2 snacks per day.  Aim for no more than 5 hours between eating.  If you eat breakfast, please do so within one hour of getting up.   Each meal should contain half fruits/vegetables, one quarter protein, and one quarter carbs (no bigger than a computer mouse)  Cut down on sweet beverages. This includes juice, soda, and sweet tea.   Drink at least 1 glass of water with each meal and aim for at least 8 glasses per day  Exercise at least 150 minutes every week.

## 2021-07-30 NOTE — Progress Notes (Signed)
   LOIC HOBIN is a 40 y.o. male who presents today for an office visit.  Assessment/Plan:  Influenza Rapid flu here negative thought this  likely false negative given that he has been taking his son's tamiflu.  We will extended his course of tamiflu for a full 5 day course. Given that tomorrow is day 5 of his symptoms and his symptoms are improving and he has not had a fever for over 24 hours it would be reasonable for him to return to work tomorrow.  Recommend the patient continue to wear a mask while around others.  He can continue over-the-counter meds.    Subjective:  HPI:  Patient here with flulike ymptoms.  Symptoms started 4 days ago.  Son has been sick with the flu.  Symptoms initially include body aches, myalgias, malaise, and fever.  Symptoms have improved over the last couple of days.  He took some of his son's Tamiflu which she developed.  He has not had any fever for the last 48 hours.       Objective:  Physical Exam: BP 103/66   Pulse (!) 103   Temp 98 F (36.7 C) (Temporal)   Ht 5\' 7"  (1.702 m)   Wt 229 lb (103.9 kg)   SpO2 98%   BMI 35.87 kg/m   Gen: No acute distress, resting comfortably CV: Regular rate and rhythm with no murmurs appreciated Pulm: Normal work of breathing, clear to auscultation bilaterally with no crackles, wheezes, or rhonchi Neuro: Grossly normal, moves all extremities Psych: Normal affect and thought content      Namya Voges M. , MD 07/30/2021 3:21 PM

## 2022-01-29 ENCOUNTER — Telehealth: Payer: Self-pay | Admitting: Physician Assistant

## 2022-01-29 NOTE — Telephone Encounter (Signed)
Please advise -  ? ? ?Patient ?Name: ?Garrett Hull ?KER ?Gender: Male ?DOB: [birth year] ?Age: 41 Y 1 M 15 D ?Return ?Phone ?Number: ?[phone] ?(Primary) ?Address: ?City/ ?State/ ?Zip: [address] [place] ? [address] ?[facility] - ?Client ?Health visitor at [facility] ?Provider Jarold Motto- PA ?Contact Type Call ?Who Is Calling Patient / Member / Family / Caregiver ?Call Type Triage / Clinical ?Relationship To Patient Self ?Return Phone Number [phone] (Primary) ?Chief Complaint CHEST PAIN - pain, pressure, heaviness or ?tightness ?Reason for Call Symptomatic / Request for Health Information ?Initial Comment Caller states he is having chest pain and stomach ?pain. Was transferred from the office for triage. ?GOTO Facility Not Listed [facility] ER per [name] ?Translation No ?Nurse Assessment ?Nurse: Charna Elizabeth, RN, Cathy Date/Time (Eastern Time): 01/29/2022 8:43:13 AM ?Confirm and document reason for call. If ?symptomatic, describe symptoms. ?---Garrett Hull states he developed upper, right abdominal ?pain (current pain rated as a 4 on the 1 to 10 scale) ?about 3 days ago and chest pain about 2 months ago ?(On and off episodes. Last episode was yesterday and ?it lasted about 4 hours that he rates as a 3 on the 1 to 10 ?scale). No severe breathing or blueness around his lips. ?Does the patient have any new or worsening ?symptoms? ---Yes ?Will a triage be completed? ---Yes ?Related visit to physician within the last 2 weeks? ---No ?Does the PT have any chronic conditions? (i.e. ?diabetes, asthma, this includes High risk factors for ?pregnancy, etc.) ?---Yes ?List chronic conditions. ---Stomach Ulcers in the past ?Is this a behavioral health or substance abuse call? ---No ?Guidelines ?Guideline Title Affirmed Question Affirmed Notes Nurse Date/Time (Eastern ?Time) ?Chest Pain [1] Chest pain (or ?"angina") comes ?and goes AND [2] ?Charna Elizabeth, RN, Lynden Ang 01/29/2022  8:46:52 ?AM ?PLEASE NOTE: All timestamps contained within this report are represented as [place] Standard Time. ?CONFIDENTIALTY NOTICE: This fax transmission is intended only for the addressee. It contains information that is legally privileged, confidential or ?otherwise protected from use or disclosure. If you are not the intended recipient, you are strictly prohibited from reviewing, disclosing, copying using ?or disseminating any of this information or taking any action in reliance on or regarding this information. If you have received this fax in error, please ?notify us immediately by telephone so that we can arrange for its return to [place]. Phone: [phone], Toll-Free: [phone], Fax: [phone] ?Page: 2 of 2 ?Call Id: [id number] ?Guidelines ?Guideline Title Affirmed Question Affirmed Notes Nurse Date/Time (Eastern ?Time) ?is happening more ?often (increasing in ?frequency) or getting ?worse (increasing in ?severity) (Exception: ?chest pains that last ?only a few seconds) ?Disp. Time (Eastern ?Time) Disposition Final User ?01/29/2022 8:42:14 AM Send to Urgent Exie Parody, Miranda ?01/29/2022 8:49:58 AM Go to ED Now Yes Charna Elizabeth, RN, Lynden Ang ?Caller Disagree/Comply Comply ?Caller Understands Yes ?PreDisposition Call Doctor ?Care Advice Given Per Guideline ?GO TO ED NOW: * You need to be seen in the Emergency Department. * Go to the ED at ___________ Hospital. * Leave now. ?Drive carefully. ANOTHER ADULT SHOULD DRIVE: * It is better and safer if another adult drives instead of you. NOTHING BY ?MOUTH: * Do not eat or drink anything for now. CALL EMS IF: * Severe difficulty breathing occurs * Passes out or becomes too ?weak to stand * You become worse CARE ADVICE given per Chest Pain (Adult) guideline. BRING MEDICINES: * Bring a list ?of your current medicines when you go to the Emergency Department (  ER). * Bring the pill bottles too. This will help the doctor (or ?NP/PA) to make certain you are taking the right  medicines and the right dose. ?Referrals ?GO TO FACILITY OTHER - SPECIFY ?

## 2022-01-30 NOTE — Telephone Encounter (Signed)
Pt seen at ED 01/29/22

## 2022-03-07 ENCOUNTER — Encounter: Payer: Self-pay | Admitting: Family

## 2022-03-07 ENCOUNTER — Ambulatory Visit: Payer: Managed Care, Other (non HMO) | Admitting: Family

## 2022-03-07 VITALS — BP 116/78 | HR 101 | Temp 98.7°F | Ht 67.0 in | Wt 242.1 lb

## 2022-03-07 DIAGNOSIS — Z3009 Encounter for other general counseling and advice on contraception: Secondary | ICD-10-CM | POA: Diagnosis not present

## 2022-03-07 DIAGNOSIS — R5383 Other fatigue: Secondary | ICD-10-CM

## 2022-05-21 ENCOUNTER — Encounter: Payer: Self-pay | Admitting: Physician Assistant

## 2022-05-21 ENCOUNTER — Ambulatory Visit: Payer: 59 | Admitting: Physician Assistant

## 2022-05-21 VITALS — BP 110/80 | HR 101 | Temp 98.7°F | Ht 67.0 in | Wt 237.0 lb

## 2022-05-21 DIAGNOSIS — M25512 Pain in left shoulder: Secondary | ICD-10-CM | POA: Diagnosis not present

## 2022-05-21 DIAGNOSIS — G8929 Other chronic pain: Secondary | ICD-10-CM

## 2022-05-21 MED ORDER — PREDNISONE 20 MG PO TABS: 40.0000 mg | ORAL_TABLET | Freq: Every day | ORAL | 0 refills | Status: DC
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
## 2022-05-21 NOTE — Patient Instructions (Signed)
It was great to see you!  Start oral steroid for your pain.  I'm referring you to Dr. Ave Filter -- best shoulder ortho in town. They will call you, here is his info:

## 2022-05-27 ENCOUNTER — Encounter: Payer: Self-pay | Admitting: Physician Assistant

## 2022-05-27 ENCOUNTER — Ambulatory Visit (INDEPENDENT_AMBULATORY_CARE_PROVIDER_SITE_OTHER): Payer: 59 | Admitting: Physician Assistant

## 2022-05-27 ENCOUNTER — Ambulatory Visit (INDEPENDENT_AMBULATORY_CARE_PROVIDER_SITE_OTHER)
Admission: RE | Admit: 2022-05-27 | Discharge: 2022-05-27 | Disposition: A | Discharge status: Home / Self Care | Payer: 59 | Source: Ambulatory Visit | Attending: Physician Assistant | Admitting: Physician Assistant

## 2022-05-27 VITALS — BP 110/80 | HR 106 | Temp 100.4°F | Ht 67.0 in | Wt 232.4 lb

## 2022-05-27 DIAGNOSIS — R509 Fever, unspecified: Secondary | ICD-10-CM

## 2022-05-27 LAB — POC COVID19 BINAXNOW: SARS Coronavirus 2 Ag: NEGATIVE

## 2022-05-27 LAB — POC INFLUENZA A&B (BINAX/QUICKVUE)
Influenza A, POC: NEGATIVE
Influenza B, POC: NEGATIVE

## 2022-05-27 MED ORDER — DOXYCYCLINE HYCLATE 100 MG PO TABS: 100.0000 mg | ORAL_TABLET | Freq: Two times a day (BID) | ORAL | 0 refills | Status: DC
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
## 2022-05-27 NOTE — Patient Instructions (Signed)
It was great to see you!  Start antibiotic (doxycycline) and the steroid  An order for an xray has been put in for you. To get your xray, you can walk in at the [facility] location without a scheduled appointment.  The address is [address] It is across the street from [facility]. X-ray is located in the basement.  Hours of operation are M-F 8:30am to 5:00pm. Please note that they are closed for lunch between 12:30 and 1:00pm.  If any WORSENING OF SYMPTOMS --> GO TO THE ER   Take care,  Jarold Motto PA-C

## 2022-05-28 ENCOUNTER — Other Ambulatory Visit: Payer: Self-pay | Admitting: Physician Assistant

## 2022-05-28 MED ORDER — AMOXICILLIN-POT CLAVULANATE 875-125 MG PO TABS: 1.0000 | ORAL_TABLET | Freq: Two times a day (BID) | ORAL | 0 refills | Status: DC

## 2022-11-03 ENCOUNTER — Encounter: Payer: Self-pay | Admitting: Physician Assistant

## 2022-11-03 ENCOUNTER — Ambulatory Visit (INDEPENDENT_AMBULATORY_CARE_PROVIDER_SITE_OTHER): Payer: 59 | Admitting: Physician Assistant

## 2022-11-03 VITALS — BP 122/80 | HR 95 | Temp 98.2°F | Ht 67.0 in | Wt 239.0 lb

## 2022-11-03 DIAGNOSIS — G8929 Other chronic pain: Secondary | ICD-10-CM | POA: Diagnosis not present

## 2022-11-03 DIAGNOSIS — M25512 Pain in left shoulder: Secondary | ICD-10-CM

## 2022-11-03 MED ORDER — ACETAMINOPHEN-CODEINE 300-30 MG PO TABS: 1.0000 | ORAL_TABLET | ORAL | 0 refills | Status: DC | PRN

## 2022-11-03 NOTE — Patient Instructions (Addendum)
It was great to see you!  You have an appointment tomorrow with Dr Glennon Mac Please be there at 745am!  Their location:  [place] [facility]  [address] on the 1st floor Phone number [phone] Fax [phone].   This location is across the street from the entrance to [employer] and in the same complex as the [facility]   Take care,  Inda Coke PA-C
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
## 2022-11-03 NOTE — Progress Notes (Unsigned)
    Garrett Hull D.[place] Phone: [phone]   Assessment and Plan:     There are no diagnoses linked to this encounter.  ***   Pertinent previous records reviewed include ***   Follow Up: ***     Subjective:   I, Tiffancy Moger, am serving as a Education administrator for Doctor Glennon Mac  Chief Complaint: left shoulder pain   HPI:   11/04/2022 Patient is a 42 year old male complaining of left shoulder pain. Patient states   Relevant Historical Information: ***  Additional pertinent review of systems negative.   Current Outpatient Medications:    acetaminophen (TYLENOL) 500 MG tablet, Take 500-1,000 mg by mouth as needed., Disp: , Rfl:    acetaminophen-codeine (TYLENOL #3) 300-30 MG tablet, Take 1-2 tablets by mouth every 4 (four) hours as needed for moderate pain., Disp: 30 tablet, Rfl: 0   sildenafil (VIAGRA) 100 MG tablet, Take 100 mg by mouth daily as needed., Disp: , Rfl:    Objective:     There were no vitals filed for this visit.    There is no height or weight on file to calculate BMI.    Physical Exam:    ***   Electronically signed by:  Garrett Hull D.[facility] 4:03 PM 11/03/22

## 2022-11-04 ENCOUNTER — Ambulatory Visit (INDEPENDENT_AMBULATORY_CARE_PROVIDER_SITE_OTHER): Payer: 59 | Admitting: Sports Medicine

## 2022-11-04 ENCOUNTER — Encounter: Payer: Self-pay | Admitting: Sports Medicine

## 2022-11-04 ENCOUNTER — Ambulatory Visit (INDEPENDENT_AMBULATORY_CARE_PROVIDER_SITE_OTHER): Payer: 59

## 2022-11-04 VITALS — BP 130/80 | HR 103 | Ht 67.0 in | Wt 239.0 lb

## 2022-11-04 DIAGNOSIS — M25812 Other specified joint disorders, left shoulder: Secondary | ICD-10-CM | POA: Diagnosis not present

## 2022-11-04 DIAGNOSIS — K259 Gastric ulcer, unspecified as acute or chronic, without hemorrhage or perforation: Secondary | ICD-10-CM | POA: Diagnosis not present

## 2022-11-04 DIAGNOSIS — G8929 Other chronic pain: Secondary | ICD-10-CM

## 2022-11-04 DIAGNOSIS — M25512 Pain in left shoulder: Secondary | ICD-10-CM

## 2022-11-04 DIAGNOSIS — T39395A Adverse effect of other nonsteroidal anti-inflammatory drugs [NSAID], initial encounter: Secondary | ICD-10-CM

## 2022-11-04 HISTORY — DX: Gastric ulcer, unspecified as acute or chronic, without hemorrhage or perforation: K25.9

## 2022-11-04 MED ORDER — CYCLOBENZAPRINE HCL 5 MG PO TABS: 5.0000 mg | ORAL_TABLET | Freq: Every day | ORAL | 0 refills | Status: DC

## 2022-11-04 NOTE — Patient Instructions (Addendum)
Good to see you  PT referral Shoulder HEP  Flexeril 5-10 mg nightly as needed  Tylenol [phone] mg 2-3 times a day for pain relief  4 week follow up
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
## 2022-12-04 ENCOUNTER — Ambulatory Visit (INDEPENDENT_AMBULATORY_CARE_PROVIDER_SITE_OTHER): Payer: 59

## 2022-12-04 ENCOUNTER — Ambulatory Visit (INDEPENDENT_AMBULATORY_CARE_PROVIDER_SITE_OTHER): Payer: 59 | Admitting: Sports Medicine

## 2022-12-04 VITALS — BP 132/78 | HR 103 | Ht 67.0 in | Wt 239.0 lb

## 2022-12-04 DIAGNOSIS — M25512 Pain in left shoulder: Secondary | ICD-10-CM

## 2022-12-04 DIAGNOSIS — S62339A Displaced fracture of neck of unspecified metacarpal bone, initial encounter for closed fracture: Secondary | ICD-10-CM | POA: Diagnosis not present

## 2022-12-04 DIAGNOSIS — M25812 Other specified joint disorders, left shoulder: Secondary | ICD-10-CM

## 2022-12-04 DIAGNOSIS — M79641 Pain in right hand: Secondary | ICD-10-CM | POA: Diagnosis not present

## 2022-12-04 DIAGNOSIS — G8929 Other chronic pain: Secondary | ICD-10-CM

## 2022-12-04 NOTE — Patient Instructions (Addendum)
Good to see you  RICES  Tylenol [phone] mg 2-3 times a day for pain relief  Splint at all times Referral to ortho

## 2023-08-26 ENCOUNTER — Ambulatory Visit: Payer: 59 | Admitting: Family

## 2023-10-02 ENCOUNTER — Encounter: Payer: Self-pay | Admitting: Physician Assistant

## 2023-10-30 ENCOUNTER — Ambulatory Visit: Payer: Commercial Managed Care - PPO | Admitting: Physician Assistant

## 2023-10-30 ENCOUNTER — Encounter: Payer: Self-pay | Admitting: Physician Assistant

## 2023-10-30 VITALS — BP 110/80 | HR 81 | Ht 67.0 in | Wt 236.4 lb

## 2023-10-30 DIAGNOSIS — E785 Hyperlipidemia, unspecified: Secondary | ICD-10-CM

## 2023-10-30 DIAGNOSIS — E669 Obesity, unspecified: Secondary | ICD-10-CM

## 2023-10-30 DIAGNOSIS — K625 Hemorrhage of anus and rectum: Secondary | ICD-10-CM

## 2023-10-30 DIAGNOSIS — Z720 Tobacco use: Secondary | ICD-10-CM

## 2023-10-30 DIAGNOSIS — E88819 Insulin resistance, unspecified: Secondary | ICD-10-CM | POA: Diagnosis not present

## 2023-10-30 DIAGNOSIS — Z0000 Encounter for general adult medical examination without abnormal findings: Secondary | ICD-10-CM

## 2023-10-30 DIAGNOSIS — R0981 Nasal congestion: Secondary | ICD-10-CM

## 2023-10-30 LAB — CBC WITH DIFFERENTIAL/PLATELET
Basophils Absolute: 0.1 10*3/uL (ref 0.0–0.1)
Basophils Relative: 0.8 % (ref 0.0–3.0)
Eosinophils Absolute: 0.1 10*3/uL (ref 0.0–0.7)
Eosinophils Relative: 1.2 % (ref 0.0–5.0)
HCT: 43.2 % (ref 39.0–52.0)
Hemoglobin: 14.2 g/dL (ref 13.0–17.0)
Lymphocytes Relative: 26 % (ref 12.0–46.0)
Lymphs Abs: 2.9 10*3/uL (ref 0.7–4.0)
MCHC: 32.8 g/dL (ref 30.0–36.0)
MCV: 90.1 fL (ref 78.0–100.0)
Monocytes Absolute: 1 10*3/uL (ref 0.1–1.0)
Monocytes Relative: 8.6 % (ref 3.0–12.0)
Neutro Abs: 7.1 10*3/uL (ref 1.4–7.7)
Neutrophils Relative %: 63.4 % (ref 43.0–77.0)
Platelets: 325 10*3/uL (ref 150.0–400.0)
RBC: 4.8 Mil/uL (ref 4.22–5.81)
RDW: 13.7 % (ref 11.5–15.5)
WBC: 11.2 10*3/uL — ABNORMAL HIGH (ref 4.0–10.5)

## 2023-10-30 LAB — LIPID PANEL
Cholesterol: 184 mg/dL (ref 0–200)
HDL: 29.5 mg/dL — ABNORMAL LOW (ref >39.00)
LDL Cholesterol: 124 mg/dL — ABNORMAL HIGH (ref 0–99)
NonHDL: 154.53
Total CHOL/HDL Ratio: 6
Triglycerides: 154 mg/dL — ABNORMAL HIGH (ref 0.0–149.0)
VLDL: 30.8 mg/dL (ref 0.0–40.0)

## 2023-10-30 LAB — COMPREHENSIVE METABOLIC PANEL
ALT: 30 U/L (ref 0–53)
AST: 16 U/L (ref 0–37)
Albumin: 4 g/dL (ref 3.5–5.2)
Alkaline Phosphatase: 58 U/L (ref 39–117)
BUN: 9 mg/dL (ref 6–23)
CO2: 27 meq/L (ref 19–32)
Calcium: 8.8 mg/dL (ref 8.4–10.5)
Chloride: 105 meq/L (ref 96–112)
Creatinine, Ser: 0.92 mg/dL (ref 0.40–1.50)
GFR: 103.06 mL/min (ref >60.00)
Glucose, Bld: 85 mg/dL (ref 70–99)
Potassium: 4 meq/L (ref 3.5–5.1)
Sodium: 140 meq/L (ref 135–145)
Total Bilirubin: 0.3 mg/dL (ref 0.2–1.2)
Total Protein: 6.9 g/dL (ref 6.0–8.3)

## 2023-10-30 LAB — HEMOGLOBIN A1C: Hgb A1c MFr Bld: 5.6 % (ref 4.6–6.5)

## 2023-10-30 MED ORDER — WEGOVY 0.25 MG/0.5ML ~~LOC~~ SOAJ: 0.2500 mg | SUBCUTANEOUS | 1 refills | Status: DC

## 2023-10-30 MED ORDER — DOXYCYCLINE HYCLATE 100 MG PO TABS
100.0000 mg | ORAL_TABLET | Freq: Two times a day (BID) | ORAL | 0 refills | Status: DC
Start: 2023-10-30 — End: 2023-11-17
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
## 2023-10-30 NOTE — Patient Instructions (Addendum)
It was great to see you!  I have sent in Wegovy 0.25 mg weekly -- prior authorization will be done and we will let you know if/when covered I have also send in doxycycline antibiotic(s) and gastroenterology referral  Please go to the lab for blood work.   Our office will call you with your results unless you have chosen to receive results via MyChart.  If your blood work is normal we will follow-up each year for physicals and as scheduled for chronic medical problems.  If anything is abnormal we will treat accordingly and get you in for a follow-up.  Take care,  Lelon Mast

## 2023-11-16 ENCOUNTER — Encounter: Payer: Self-pay | Admitting: Family Medicine

## 2023-11-16 ENCOUNTER — Ambulatory Visit: Admitting: Family Medicine

## 2023-11-16 VITALS — BP 118/78 | HR 79 | Temp 98.2°F | Resp 18 | Ht 67.0 in | Wt 237.5 lb

## 2023-11-16 DIAGNOSIS — M25561 Pain in right knee: Secondary | ICD-10-CM | POA: Diagnosis not present

## 2023-11-16 MED ORDER — PREDNISONE 20 MG PO TABS: 40.0000 mg | ORAL_TABLET | Freq: Every day | ORAL | 0 refills | Status: AC

## 2023-11-16 NOTE — Patient Instructions (Signed)
 Dr. Kalman Jewels Sports Medicine at [facility]  [address] on the 1st floor Phone number [phone]   Ice.  Sent prednisone
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
## 2023-11-17 ENCOUNTER — Ambulatory Visit: Admitting: Family Medicine

## 2023-11-17 ENCOUNTER — Ambulatory Visit (INDEPENDENT_AMBULATORY_CARE_PROVIDER_SITE_OTHER)

## 2023-11-17 ENCOUNTER — Other Ambulatory Visit: Payer: Self-pay

## 2023-11-17 VITALS — BP 132/92 | HR 94 | Ht 67.0 in | Wt 240.0 lb

## 2023-11-17 DIAGNOSIS — G8929 Other chronic pain: Secondary | ICD-10-CM | POA: Diagnosis not present

## 2023-11-17 DIAGNOSIS — M25561 Pain in right knee: Secondary | ICD-10-CM | POA: Diagnosis not present
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
## 2023-11-17 NOTE — Patient Instructions (Addendum)
Thank you for coming in today.   You received an injection today. Seek immediate medical attention if the joint becomes red, extremely painful, or is oozing fluid.   Let me know how this goes.

## 2023-11-30 ENCOUNTER — Encounter: Payer: Self-pay | Admitting: Family Medicine

## 2023-11-30 ENCOUNTER — Ambulatory Visit: Admitting: Family Medicine

## 2023-11-30 ENCOUNTER — Other Ambulatory Visit: Payer: Self-pay

## 2023-11-30 DIAGNOSIS — G8929 Other chronic pain: Secondary | ICD-10-CM | POA: Diagnosis not present

## 2023-11-30 DIAGNOSIS — M25561 Pain in right knee: Secondary | ICD-10-CM

## 2023-11-30 NOTE — Patient Instructions (Addendum)
 Thank you for coming in today.  You should hear from MRI scheduling within 1 week. If you do not hear please let me know.

## 2023-11-30 NOTE — Progress Notes (Signed)
   I, Stevenson Clinch, CMA acting as a scribe for Clementeen Graham, MD.  Garrett Hull is a 43 y.o. male who presents to [facility] at [facility] today for f/u R knee pain. Pt was last seen by Dr. Denyse Amass on 11/17/23 and was given a R knee steroid injection.  Today, pt reports some improvement of pain and swelling s/p steroid injection. Continues to have some pain at medial aspect of the knee. Notes that the knee has been popping more recently. Taking IBU with minimal relief. Has been using compression while more active.   Marland Kitchen  He notes continued popping and clicking in the knee along with discomfort and pain.  He was unable to play golf due to the knee pain. Dx imaging: 11/17/23 R knee XR  Pertinent review of systems: No fevers or chills  Relevant historical information: NSAID induced gastric ulcer   Exam:  BP 124/86   Pulse 88   Ht 5\' 7"  (1.702 m)   Wt 236 lb (107 kg)   SpO2 96%   BMI 36.96 kg/m  General: Well Developed, well nourished, and in no acute distress.   MSK: Right knee normal-appearing tender palpation medial joint line. Stable ligamentous exam. Positive varus test.       Assessment and Plan: 43 y.o. male with chronic right knee pain.  X-ray obtained at the last visit did not show much arthritis.  He did have a steroid injection which has helped but he continues to experience pain and dysfunction in the knee.  Plan for MRI to further characterize source of pain and for potential surgical planning.   PDMP not reviewed this encounter. Orders Placed This Encounter  Procedures   MR Knee Right Wo Contrast    Standing Status:   Future    Expiration Date:   11/29/2024    What is the patient's sedation requirement?:   No Sedation    Does the patient have a pacemaker or implanted devices?:   No    Preferred imaging location?:   [facility] (table limit-350lbs)   No orders of the defined types were placed in this encounter.    Discussed warning  signs or symptoms. Please see discharge instructions. Patient expresses understanding.   The above documentation has been reviewed and is accurate and complete Clementeen Graham, M.D.

## 2023-12-07 NOTE — Progress Notes (Signed)
 Mild knee arthritis is present.

## 2023-12-19 ENCOUNTER — Ambulatory Visit

## 2023-12-19 DIAGNOSIS — G8929 Other chronic pain: Secondary | ICD-10-CM | POA: Diagnosis not present

## 2023-12-19 DIAGNOSIS — M25561 Pain in right knee: Secondary | ICD-10-CM | POA: Diagnosis not present

## 2023-12-21 ENCOUNTER — Telehealth: Payer: Self-pay | Admitting: Family Medicine

## 2023-12-21 NOTE — Telephone Encounter (Signed)
 MRI completed 12/19/23, not yet resulted.   Forwarding to Dr. Denyse Amass.

## 2023-12-21 NOTE — Telephone Encounter (Signed)
 I took a look myself at the MRI pictures.  I think you have some areas of arthritis especially underneath the kneecap and I can see that the joint is swollen.  I think we should wait until the radiologist generates a formal report before deciding on next steps.

## 2023-12-21 NOTE — Telephone Encounter (Signed)
 Pt was informed to let Dr. Denyse Amass know that he had his R knee MRI done in [place] on 12/19/2023.

## 2024-01-06 NOTE — Progress Notes (Signed)
 Knee MRI shows patellar tendinitis as a possible source of pain.  You do have a partial ACL tear which could be causing the pain but I am not sure.  Recommend return to clinic to go over the results in full detail and talk about treatment plan and options from here.

## 2024-01-12 NOTE — Progress Notes (Unsigned)
   Joanna Muck, PhD, LAT, ATC acting as a scribe for Garlan Juniper, MD.  Garrett Hull is a 43 y.o. male who presents to [facility] at [facility] today for f/u R knee pain w/ MRI review. Pt was last seen by Dr. Alease Hunter on 11/30/23 and a MRI was ordered  Today, pt reports ***  Dx imaging: 12/19/23 R knee MRI 11/17/23 R knee XR  Pertinent review of systems: ***  Relevant historical information: ***   Exam:  There were no vitals taken for this visit. General: Well Developed, well nourished, and in no acute distress.   MSK: ***    Lab and Radiology Results No results found for this or any previous visit (from the past 72 hours). No results found.     Assessment and Plan: 43 y.o. male with ***   PDMP not reviewed this encounter. No orders of the defined types were placed in this encounter.  No orders of the defined types were placed in this encounter.    Discussed warning signs or symptoms. Please see discharge instructions. Patient expresses understanding.   ***

## 2024-01-13 ENCOUNTER — Encounter: Payer: Self-pay | Admitting: Family Medicine

## 2024-01-13 ENCOUNTER — Ambulatory Visit: Admitting: Family Medicine

## 2024-01-13 VITALS — BP 110/82 | HR 86 | Ht 67.0 in | Wt 237.0 lb

## 2024-01-13 DIAGNOSIS — M25561 Pain in right knee: Secondary | ICD-10-CM

## 2024-01-13 DIAGNOSIS — G8929 Other chronic pain: Secondary | ICD-10-CM | POA: Diagnosis not present

## 2024-01-13 NOTE — Patient Instructions (Signed)
 Thank you for coming in today.   A referral for physical therapy has been submitted. A representative from the physical therapy office will contact you to coordinate scheduling after confirming your benefits with your insurance provider. If you do not hear from the physical therapy office within the next 1-2 weeks, please let us  know.   A referral has also been placed to see Dr. Hermina Loosen with Orthopedics.   Let us  know how things are going.   See you back as needed.

## 2024-02-10 ENCOUNTER — Ambulatory Visit (HOSPITAL_BASED_OUTPATIENT_CLINIC_OR_DEPARTMENT_OTHER): Admitting: Orthopaedic Surgery

## 2024-02-10 ENCOUNTER — Telehealth (HOSPITAL_BASED_OUTPATIENT_CLINIC_OR_DEPARTMENT_OTHER): Payer: Self-pay | Admitting: Orthopaedic Surgery

## 2024-02-10 ENCOUNTER — Encounter (HOSPITAL_BASED_OUTPATIENT_CLINIC_OR_DEPARTMENT_OTHER): Payer: Self-pay | Admitting: Orthopaedic Surgery

## 2024-02-10 ENCOUNTER — Ambulatory Visit (HOSPITAL_BASED_OUTPATIENT_CLINIC_OR_DEPARTMENT_OTHER): Payer: Self-pay | Admitting: Orthopaedic Surgery

## 2024-02-10 ENCOUNTER — Other Ambulatory Visit (HOSPITAL_BASED_OUTPATIENT_CLINIC_OR_DEPARTMENT_OTHER): Payer: Self-pay

## 2024-02-10 DIAGNOSIS — S83511A Sprain of anterior cruciate ligament of right knee, initial encounter: Secondary | ICD-10-CM

## 2024-02-10 MED ORDER — OXYCODONE HCL 5 MG PO TABS
5.0000 mg | ORAL_TABLET | ORAL | 0 refills | Status: DC | PRN
  Filled 2024-02-10: qty 10, 2d supply, fill #0

## 2024-02-10 MED ORDER — ACETAMINOPHEN 500 MG PO TABS
500.0000 mg | ORAL_TABLET | Freq: Three times a day (TID) | ORAL | 0 refills | Status: AC
  Filled 2024-02-10: qty 30, 10d supply, fill #0

## 2024-02-10 MED ORDER — ASPIRIN 325 MG PO TBEC
325.0000 mg | DELAYED_RELEASE_TABLET | Freq: Every day | ORAL | 0 refills | Status: DC
  Filled 2024-02-10: qty 14, 14d supply, fill #0
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
## 2024-02-10 NOTE — Telephone Encounter (Signed)
Pre-op information ?

## 2024-07-18 ENCOUNTER — Encounter: Payer: Self-pay | Admitting: Radiology

## 2024-08-01 ENCOUNTER — Telehealth: Payer: Self-pay | Admitting: *Deleted

## 2024-08-01 NOTE — Telephone Encounter (Signed)
 Copied from CRM #[id number]. Topic: Referral - Request for Referral >> Aug 01, 2024  2:41 PM Jasmin G wrote: Did the patient discuss referral with their provider in the last year? Yes (If No - schedule appointment) (If Yes - send message)  Appointment offered? No  Type of order/referral and detailed reason for visit: Sleep Study   Preference of office, provider, location: No, just close to his location  If referral order, have you been seen by this specialty before? No (If Yes, this issue or another issue? When? Where?  Can we respond through MyChart? No

## 2024-08-01 NOTE — Telephone Encounter (Signed)
 Pt requesting sleep study. Please advise.

## 2024-08-02 ENCOUNTER — Other Ambulatory Visit: Payer: Self-pay | Admitting: Physician Assistant

## 2024-08-02 DIAGNOSIS — R29818 Other symptoms and signs involving the nervous system: Secondary | ICD-10-CM

## 2024-08-02 NOTE — Telephone Encounter (Signed)
 Spoke to pt told him referral was placed for Pulmonary for Sleep study, someone will contact you to schedule an appt. Also a letter was sent to your My Chart if you do not hear from someone in 3-5 days please call to schedule an appt. Pt verbalized understanding.

## 2024-08-24 ENCOUNTER — Encounter: Payer: Self-pay | Admitting: Pulmonary Disease

## 2024-08-31 ENCOUNTER — Ambulatory Visit (HOSPITAL_BASED_OUTPATIENT_CLINIC_OR_DEPARTMENT_OTHER)

## 2024-09-20 ENCOUNTER — Ambulatory Visit (INDEPENDENT_AMBULATORY_CARE_PROVIDER_SITE_OTHER)

## 2024-09-20 ENCOUNTER — Encounter (HOSPITAL_BASED_OUTPATIENT_CLINIC_OR_DEPARTMENT_OTHER): Payer: Self-pay

## 2024-09-20 VITALS — BP 134/80 | HR 99 | Ht 67.0 in | Wt 238.0 lb

## 2024-09-20 DIAGNOSIS — F17290 Nicotine dependence, other tobacco product, uncomplicated: Secondary | ICD-10-CM

## 2024-09-20 DIAGNOSIS — Z720 Tobacco use: Secondary | ICD-10-CM

## 2024-09-20 DIAGNOSIS — G4719 Other hypersomnia: Secondary | ICD-10-CM

## 2024-09-20 DIAGNOSIS — G4710 Hypersomnia, unspecified: Secondary | ICD-10-CM

## 2024-09-20 NOTE — Patient Instructions (Signed)
 Complete home sleep test; ordered today.  Plan for follow up in 6-8 weeks.

## 2024-09-20 NOTE — Progress Notes (Signed)
 "  @Patient  ID: Garrett Hull, male    DOB: 03/29/1981, 44 y.o.   MRN: [id number]  Chief Complaint  Patient presents with   Establish Care    New sleep     Referring provider: Job Lukes, PA  HPI: Discussed the use of AI scribe software for clinical note transcription with the patient, who gave verbal consent to proceed.  History of Present Illness Garrett Hull is a 44 year old male who presents with concerns of sleep apnea.  He experiences episodes of apnea during sleep, as noted by his wife, along with snoring and excessive daytime fatigue. He often feels the need to nap during the day and sometimes falls asleep while watching TV. His Epworth Sleepiness Scale score is 11.  His sleep schedule is consistent, with bedtime between 10 and 11 PM, taking 15 to 20 minutes to fall asleep, waking once during the night, and rising at around 6:15 AM. He has gained approximately 20 pounds over the last couple of years.  He has not been previously evaluated for sleep apnea. He denies any history of high blood pressure, heart attack, stroke, or heart arrhythmias. His past surgical history includes an appendectomy and tonsillectomy with adenoidectomy.  He has a history of smoking and now vapes. He consumes approximately 12 alcoholic drinks per week, with increased consumption on pool nights and weekends. He denies the use of recreational drugs or taking any medication to aid sleep.  He reports occasional chest pain and tightness, which he attributes to weight issues, but denies any associated cough or palpitations.   TEST/EVENTS : n/a  Allergies[1]  Immunization History  Administered Date(s) Administered   Influenza, Quadrivalent, Recombinant, Inj, Pf 07/09/2018   Influenza,inj,Quad PF,6+ Mos 07/03/2018   PFIZER(Purple Top)SARS-COV-2 Vaccination 04/24/2020, 05/17/2020   Td (Adult),5 Lf Tetanus Toxid, Preservative Free 04/03/2005   Tdap 04/04/2008, 05/21/2014    Past Medical  History:  Diagnosis Date   Anxiety and depression    CAP (community acquired pneumonia) 08/2018   Dysfunction of left eustachian tube    ED (erectile dysfunction)    Gastric ulcer    Genital herpes    Low back pain    strain   Migraine syndrome    since age 69   NSAID-induced gastric ulcer 11/04/2022    Tobacco History: Tobacco Use History[2] Ready to quit: Not Answered Counseling given: Not Answered   Outpatient Medications Prior to Visit  Medication Sig Dispense Refill   acetaminophen  (TYLENOL ) 500 MG tablet Take 500-1,000 mg by mouth as needed.     sildenafil (VIAGRA) 100 MG tablet Take 100 mg by mouth daily as needed.     aspirin  EC 325 MG tablet Take 1 tablet (325 mg total) by mouth daily. 14 tablet 0   oxyCODONE  (ROXICODONE ) 5 MG immediate release tablet Take 1 tablet (5 mg total) by mouth every 4 (four) hours as needed for severe pain (pain score 7-10) or breakthrough pain. 10 tablet 0   No facility-administered medications prior to visit.     Review of Systems: as per hpi  Constitutional:   No  weight loss, night sweats,  Fevers, chills, fatigue, or  lassitude.  HEENT:   No headaches,  Difficulty swallowing,  Tooth/dental problems, or  Sore throat,                No sneezing, itching, ear ache, nasal congestion, post nasal drip,   CV:  No chest pain,  Orthopnea, PND, swelling in lower extremities, anasarca,  dizziness, palpitations, syncope.   GI  No heartburn, indigestion, abdominal pain, nausea, vomiting, diarrhea, change in bowel habits, loss of appetite, bloody stools.   Resp: No shortness of breath with exertion or at rest.  No excess mucus, no productive cough,  No non-productive cough,  No coughing up of blood.  No change in color of mucus.  No wheezing.  No chest wall deformity  Skin: no rash or lesions.  GU: no dysuria, change in color of urine, no urgency or frequency.  No flank pain, no hematuria   MS:  No joint pain or swelling.  No decreased range  of motion.  No back pain.    Physical Exam  BP 134/80   Pulse 99   Ht 5' 7 (1.702 m)   Wt 238 lb (108 kg)   SpO2 96%   BMI 37.28 kg/m   GEN: A/Ox3; pleasant , NAD, well nourished    HEENT:  [place]/AT,  EACs-clear, TMs-wnl, NOSE-clear, THROAT-clear, no lesions, no postnasal drip or exudate noted. Mallampati 2  NECK:  Supple w/ fair ROM; no JVD; normal carotid impulses w/o bruits; no thyromegaly or nodules palpated; no lymphadenopathy.    RESP  Clear  P & A; w/o, wheezes/ rales/ or rhonchi. no accessory muscle use, no dullness to percussion  CARD:  RRR, no m/r/g, no peripheral edema, pulses intact, no cyanosis or clubbing.  GI:   Soft & nt; nml bowel sounds; no organomegaly or masses detected.   Musco: Warm bil, no deformities or joint swelling noted.   Neuro: alert, no focal deficits noted.    Skin: Warm, no lesions or rashes    Lab Results:  CBC    Component Value Date/Time   WBC 11.2 (H) 10/30/2023 0933   RBC 4.80 10/30/2023 0933   HGB 14.2 10/30/2023 0933   HCT 43.2 10/30/2023 0933   PLT 325.0 10/30/2023 0933   MCV 90.1 10/30/2023 0933   MCH 30.7 11/23/2019 1631   MCHC 32.8 10/30/2023 0933   RDW 13.7 10/30/2023 0933   LYMPHSABS 2.9 10/30/2023 0933   MONOABS 1.0 10/30/2023 0933   EOSABS 0.1 10/30/2023 0933   BASOSABS 0.1 10/30/2023 0933    BMET    Component Value Date/Time   NA 140 10/30/2023 0933   K 4.0 10/30/2023 0933   CL 105 10/30/2023 0933   CO2 27 10/30/2023 0933   GLUCOSE 85 10/30/2023 0933   BUN 9 10/30/2023 0933   CREATININE 0.92 10/30/2023 0933   CALCIUM  8.8 10/30/2023 0933   GFRNONAA >60 11/23/2019 1631   GFRAA >60 11/23/2019 1631    BNP No results found for: BNP  ProBNP No results found for: PROBNP  Imaging: No results found.  Administration History     None           No data to display          No results found for: NITRICOXIDE   Assessment & Plan:   Assessment & Plan Excessive daytime  sleepiness  Vapes nicotine containing substance  Assessment and Plan Assessment & Plan Suspected obstructive sleep apnea Symptoms and elevated Epworth score suggest obstructive sleep apnea. Discussed cardiovascular risks from untreated condition.  - Ordered home sleep test. - Instructed to maintain usual sleep habits during test. - Schedule follow-up in 6-8 weeks to discuss results and treatment. - Offered nicotine replacement therapy to help stop vaping; patient will consider but currently declines.  Will readdress at follow up.   Return in about 7 weeks (around 11/08/2024)  for sleep study review.  Candis Dandy, PA-C 09/20/2024      [1]  Allergies Allergen Reactions   Nsaids     Hx gastric ulcers-nsaid related  [2]  Social History Tobacco Use  Smoking Status Every Day   Current packs/day: 1.00   Average packs/day: 1 pack/day for 8.0 years (8.0 ttl pk-yrs)   Types: Cigarettes  Smokeless Tobacco Never   "

## 2024-10-19 ENCOUNTER — Encounter: Payer: Self-pay | Admitting: Family

## 2024-10-19 ENCOUNTER — Ambulatory Visit: Admitting: Family

## 2024-10-19 ENCOUNTER — Telehealth: Payer: Self-pay | Admitting: Physician Assistant

## 2024-10-19 VITALS — BP 120/72 | HR 94 | Temp 97.1°F | Ht 67.0 in | Wt 239.0 lb

## 2024-10-19 DIAGNOSIS — M25511 Pain in right shoulder: Secondary | ICD-10-CM

## 2024-10-19 MED ORDER — CYCLOBENZAPRINE HCL 10 MG PO TABS: 10.0000 mg | ORAL_TABLET | Freq: Every evening | ORAL | 0 refills | Status: AC | PRN

## 2024-10-19 NOTE — Telephone Encounter (Signed)
 After pt's appointment, he came back in to me and asked if he could speak to the care team that saw him. He had recently received a call from a referral office, but they could not see him for 2 weeks, and the pt stated they would be out of town during that time.  I sent a Teams message to the care team, and they assisted the patient acordingly by getting him a STAT referral and have confirmed the pt will be seen sooner than the originally stated appointment offered.
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
## 2024-10-20 ENCOUNTER — Ambulatory Visit (HOSPITAL_BASED_OUTPATIENT_CLINIC_OR_DEPARTMENT_OTHER): Admitting: Student

## 2024-10-20 ENCOUNTER — Other Ambulatory Visit (HOSPITAL_BASED_OUTPATIENT_CLINIC_OR_DEPARTMENT_OTHER): Payer: Self-pay

## 2024-10-20 ENCOUNTER — Other Ambulatory Visit (HOSPITAL_BASED_OUTPATIENT_CLINIC_OR_DEPARTMENT_OTHER): Payer: Self-pay | Admitting: Student

## 2024-10-20 ENCOUNTER — Ambulatory Visit (HOSPITAL_BASED_OUTPATIENT_CLINIC_OR_DEPARTMENT_OTHER)

## 2024-10-20 DIAGNOSIS — M25511 Pain in right shoulder: Secondary | ICD-10-CM

## 2024-10-20 DIAGNOSIS — M5412 Radiculopathy, cervical region: Secondary | ICD-10-CM

## 2024-10-20 DIAGNOSIS — M542 Cervicalgia: Secondary | ICD-10-CM

## 2024-10-20 MED ORDER — METHYLPREDNISOLONE 4 MG PO TBPK
ORAL_TABLET | ORAL | 0 refills | Status: AC
  Filled 2024-10-20: qty 21, 6d supply, fill #0
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
## 2024-10-21 ENCOUNTER — Other Ambulatory Visit

## 2024-10-24 ENCOUNTER — Other Ambulatory Visit

## 2024-11-14 ENCOUNTER — Ambulatory Visit (HOSPITAL_BASED_OUTPATIENT_CLINIC_OR_DEPARTMENT_OTHER)
